# Patient Record
Sex: Male | Born: 1955 | Race: White | Hispanic: No | Marital: Single | Smoking: Current some day smoker
Health system: Southern US, Community
[De-identification: ages and names within clinical notes are randomized; demographics above are authoritative.]

## PROBLEM LIST (undated history)

## (undated) DIAGNOSIS — E119 Type 2 diabetes mellitus without complications: Secondary | ICD-10-CM

## (undated) DIAGNOSIS — J449 Chronic obstructive pulmonary disease, unspecified: Secondary | ICD-10-CM

## (undated) DIAGNOSIS — F259 Schizoaffective disorder, unspecified: Secondary | ICD-10-CM

## (undated) HISTORY — DX: Type 2 diabetes mellitus without complications: E11.9

## (undated) HISTORY — DX: Chronic obstructive pulmonary disease, unspecified: J44.9

## (undated) HISTORY — DX: Schizoaffective disorder, unspecified: F25.9

---

## 2000-08-02 ENCOUNTER — Emergency Department (HOSPITAL_COMMUNITY): Admission: EM | Admit: 2000-08-02 | Discharge: 2000-08-02 | Payer: Self-pay | Admitting: *Deleted

## 2000-10-30 ENCOUNTER — Emergency Department (HOSPITAL_COMMUNITY): Admission: EM | Admit: 2000-10-30 | Discharge: 2000-10-30 | Payer: Self-pay | Admitting: Emergency Medicine

## 2000-11-03 ENCOUNTER — Inpatient Hospital Stay (HOSPITAL_COMMUNITY): Admission: EM | Admit: 2000-11-03 | Discharge: 2000-11-10 | Payer: Self-pay | Admitting: Psychiatry

## 2001-01-27 ENCOUNTER — Encounter: Payer: Self-pay | Admitting: *Deleted

## 2001-01-28 ENCOUNTER — Inpatient Hospital Stay (HOSPITAL_COMMUNITY): Admission: EM | Admit: 2001-01-28 | Discharge: 2001-02-02 | Payer: Self-pay | Admitting: Psychiatry

## 2001-02-02 ENCOUNTER — Emergency Department (HOSPITAL_COMMUNITY): Admission: EM | Admit: 2001-02-02 | Discharge: 2001-02-03 | Payer: Self-pay | Admitting: *Deleted

## 2001-03-07 ENCOUNTER — Encounter: Payer: Self-pay | Admitting: Emergency Medicine

## 2001-03-07 ENCOUNTER — Emergency Department (HOSPITAL_COMMUNITY): Admission: EM | Admit: 2001-03-07 | Discharge: 2001-03-07 | Payer: Self-pay | Admitting: Emergency Medicine

## 2001-03-08 ENCOUNTER — Emergency Department (HOSPITAL_COMMUNITY): Admission: EM | Admit: 2001-03-08 | Discharge: 2001-03-08 | Payer: Self-pay | Admitting: Emergency Medicine

## 2001-03-09 ENCOUNTER — Emergency Department (HOSPITAL_COMMUNITY): Admission: EM | Admit: 2001-03-09 | Discharge: 2001-03-10 | Payer: Self-pay | Admitting: Emergency Medicine

## 2002-11-26 ENCOUNTER — Emergency Department (HOSPITAL_COMMUNITY): Admission: EM | Admit: 2002-11-26 | Discharge: 2002-11-26 | Payer: Self-pay

## 2002-11-30 ENCOUNTER — Emergency Department (HOSPITAL_COMMUNITY): Admission: EM | Admit: 2002-11-30 | Discharge: 2002-11-30 | Payer: Self-pay | Admitting: Emergency Medicine

## 2002-12-07 ENCOUNTER — Emergency Department (HOSPITAL_COMMUNITY): Admission: EM | Admit: 2002-12-07 | Discharge: 2002-12-07 | Payer: Self-pay | Admitting: Emergency Medicine

## 2003-07-27 ENCOUNTER — Emergency Department (HOSPITAL_COMMUNITY): Admission: EM | Admit: 2003-07-27 | Discharge: 2003-07-27 | Payer: Self-pay | Admitting: Emergency Medicine

## 2003-08-19 ENCOUNTER — Inpatient Hospital Stay (HOSPITAL_COMMUNITY): Admission: RE | Admit: 2003-08-19 | Discharge: 2003-08-26 | Payer: Self-pay | Admitting: Psychiatry

## 2003-08-19 ENCOUNTER — Emergency Department (HOSPITAL_COMMUNITY): Admission: EM | Admit: 2003-08-19 | Discharge: 2003-08-19 | Payer: Self-pay | Admitting: *Deleted

## 2003-12-26 ENCOUNTER — Ambulatory Visit (HOSPITAL_COMMUNITY): Admission: RE | Admit: 2003-12-26 | Discharge: 2003-12-26 | Payer: Self-pay | Admitting: *Deleted

## 2004-05-04 ENCOUNTER — Emergency Department (HOSPITAL_COMMUNITY): Admission: EM | Admit: 2004-05-04 | Discharge: 2004-05-05 | Payer: Self-pay | Admitting: Emergency Medicine

## 2004-06-20 ENCOUNTER — Inpatient Hospital Stay (HOSPITAL_COMMUNITY): Admission: EM | Admit: 2004-06-20 | Discharge: 2004-06-22 | Payer: Self-pay | Admitting: Emergency Medicine

## 2004-12-17 ENCOUNTER — Emergency Department (HOSPITAL_COMMUNITY): Admission: EM | Admit: 2004-12-17 | Discharge: 2004-12-18 | Payer: Self-pay | Admitting: Emergency Medicine

## 2004-12-21 ENCOUNTER — Emergency Department (HOSPITAL_COMMUNITY): Admission: EM | Admit: 2004-12-21 | Discharge: 2004-12-21 | Payer: Self-pay | Admitting: Emergency Medicine

## 2008-06-06 ENCOUNTER — Ambulatory Visit: Payer: Self-pay | Admitting: Psychiatry

## 2008-06-06 ENCOUNTER — Emergency Department (HOSPITAL_COMMUNITY): Admission: EM | Admit: 2008-06-06 | Discharge: 2008-06-06 | Payer: Self-pay | Admitting: Emergency Medicine

## 2008-06-06 ENCOUNTER — Inpatient Hospital Stay (HOSPITAL_COMMUNITY): Admission: RE | Admit: 2008-06-06 | Discharge: 2008-06-24 | Payer: Self-pay | Admitting: Psychiatry

## 2008-07-17 ENCOUNTER — Emergency Department (HOSPITAL_COMMUNITY): Admission: EM | Admit: 2008-07-17 | Discharge: 2008-07-17 | Payer: Self-pay | Admitting: Emergency Medicine

## 2008-08-06 ENCOUNTER — Inpatient Hospital Stay (HOSPITAL_COMMUNITY): Admission: EM | Admit: 2008-08-06 | Discharge: 2008-08-09 | Payer: Self-pay | Admitting: Emergency Medicine

## 2008-09-17 ENCOUNTER — Other Ambulatory Visit: Payer: Self-pay | Admitting: Emergency Medicine

## 2008-09-18 ENCOUNTER — Ambulatory Visit: Payer: Self-pay | Admitting: Psychiatry

## 2008-09-18 ENCOUNTER — Other Ambulatory Visit: Payer: Self-pay | Admitting: Emergency Medicine

## 2008-09-19 ENCOUNTER — Ambulatory Visit: Payer: Self-pay | Admitting: Psychiatry

## 2008-09-19 ENCOUNTER — Other Ambulatory Visit: Payer: Self-pay | Admitting: Emergency Medicine

## 2008-09-20 ENCOUNTER — Inpatient Hospital Stay (HOSPITAL_COMMUNITY): Admission: RE | Admit: 2008-09-20 | Discharge: 2008-10-03 | Payer: Self-pay | Admitting: Psychiatry

## 2008-09-20 ENCOUNTER — Other Ambulatory Visit: Payer: Self-pay | Admitting: Emergency Medicine

## 2010-03-21 IMAGING — CT CT HEAD W/O CM
1 series · 16 of 30 positions shown, 20 images · non-contrast
Comparison: 12/26/2003

CLINICAL DATA: Weakness when smoking

CT HEAD WITHOUT CONTRAST
TECHNIQUE: Contiguous axial images were obtained from the base of
the skull through the vertex without contrast

[Series 2: head routine 4.8 h37s · axial · 0.48mm/px · z∈[-176,-13]mm · 16 of 36 slices shown, 20 images]
[im 2/36  brain]
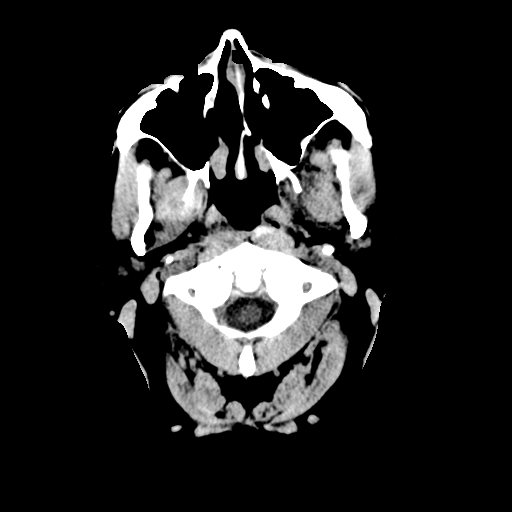
[im 2/36  bone]
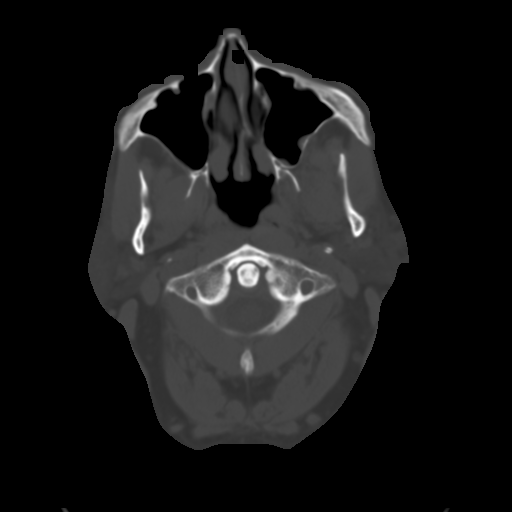
[im 4/36  brain]
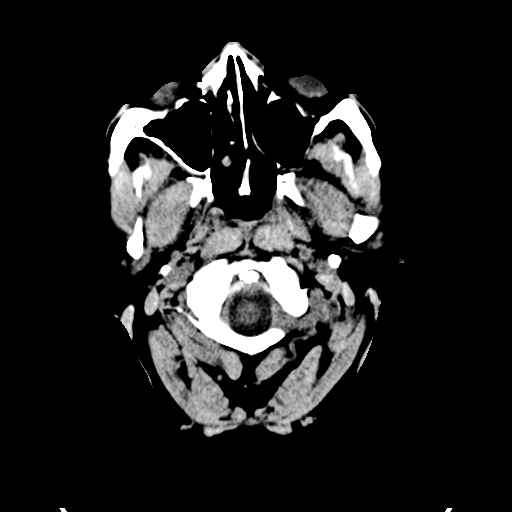
[im 7/36  brain]
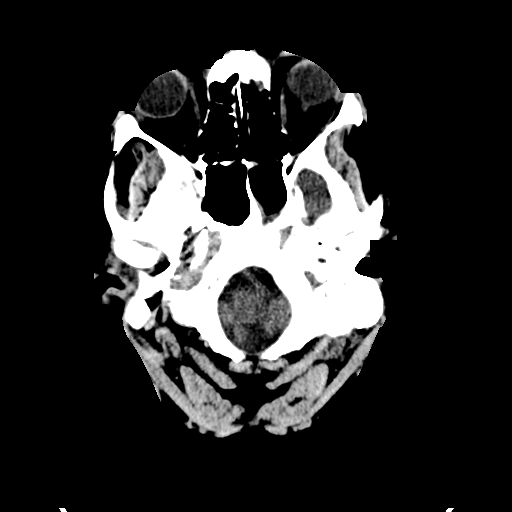
[im 9/36  brain]
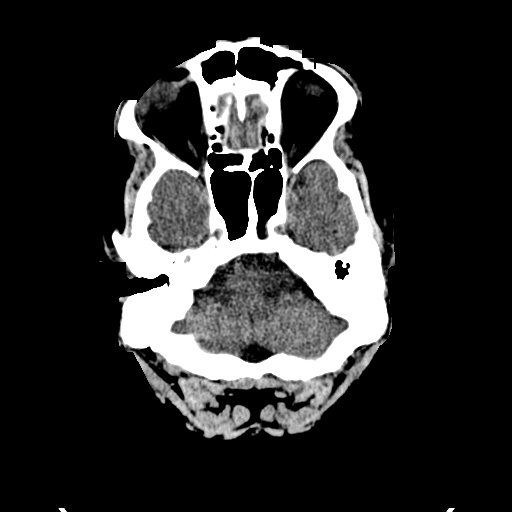
[im 10/36  brain]
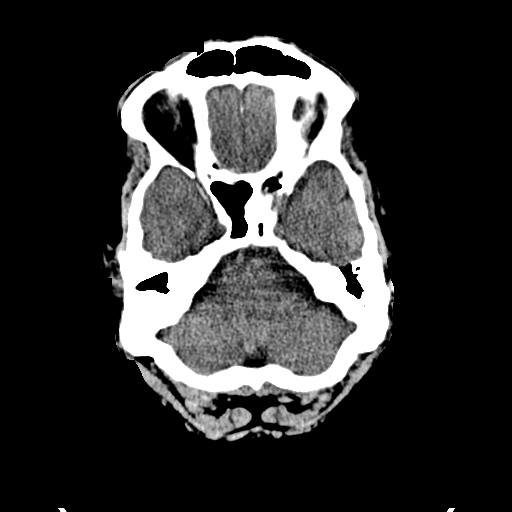
[im 10/36  bone]
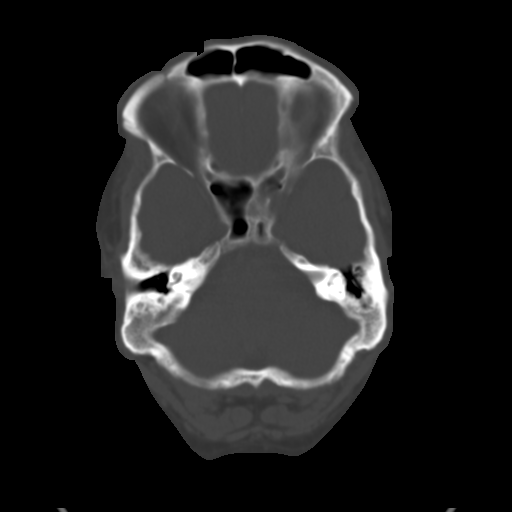
[im 13/36  brain]
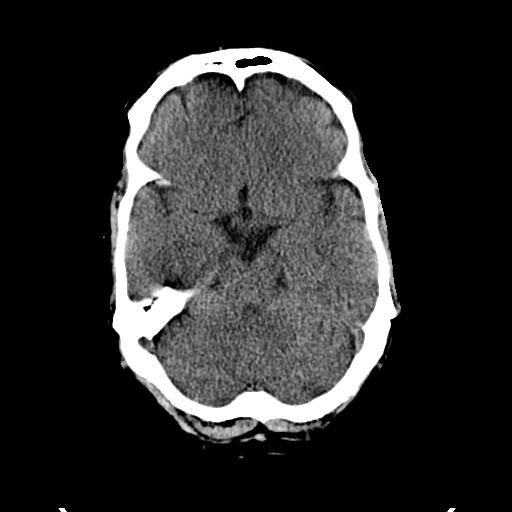
[im 15/36  brain]
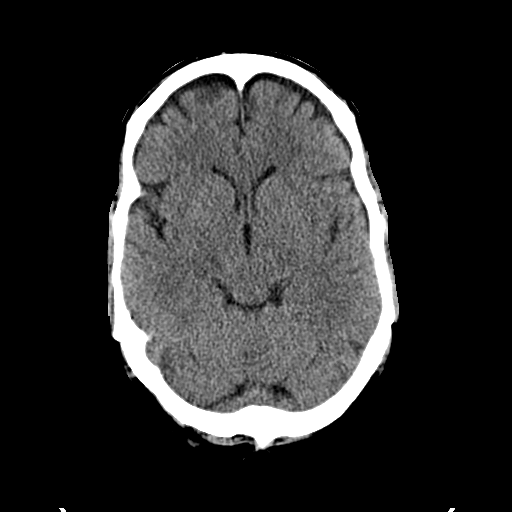
[im 17/36  brain]
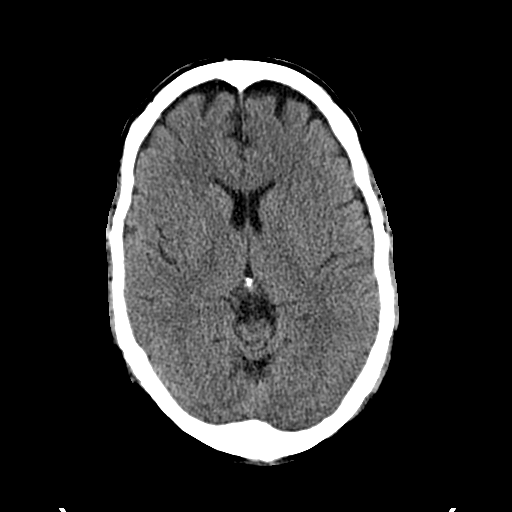
[im 19/36  brain]
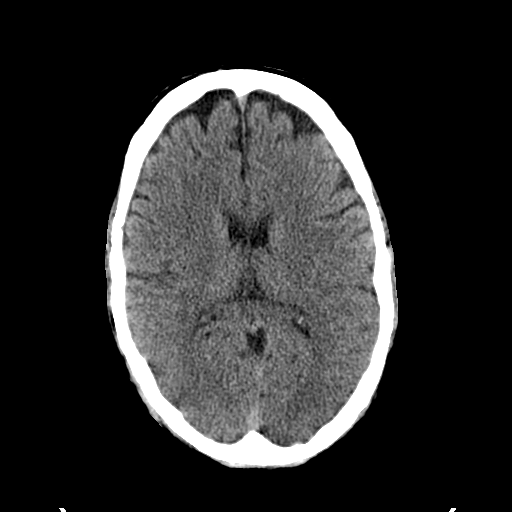
[im 19/36  bone]
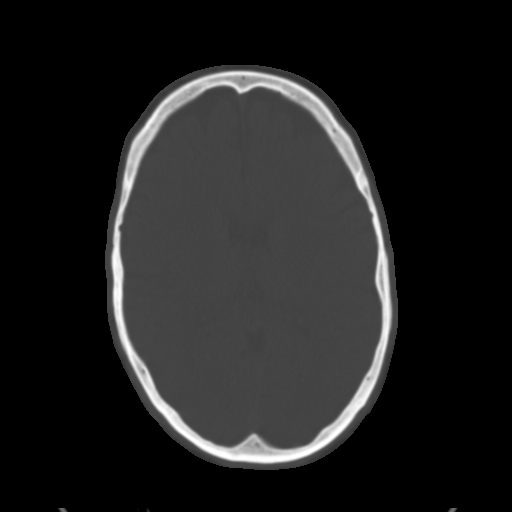
[im 21/36  brain]
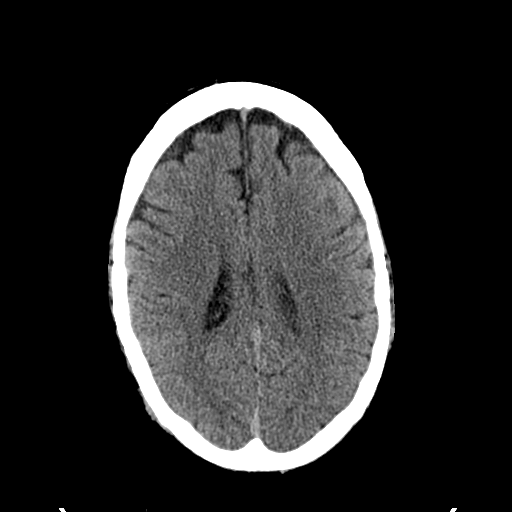
[im 23/36  brain]
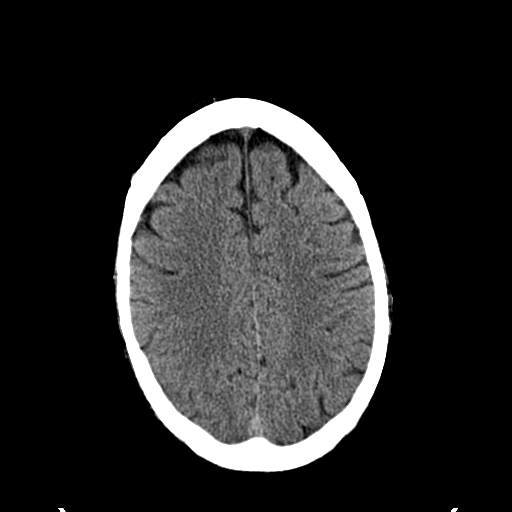
[im 26/36  brain]
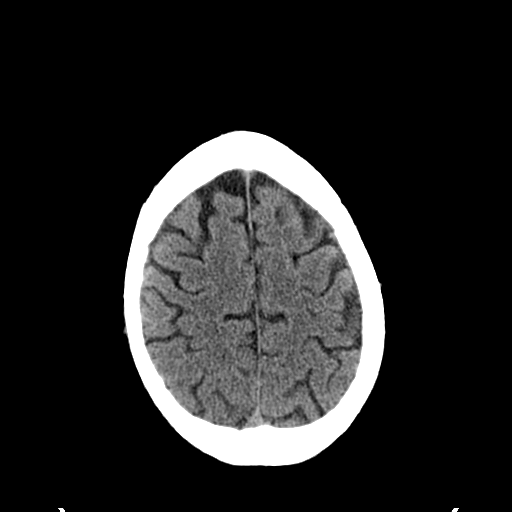
[im 27/36  brain]
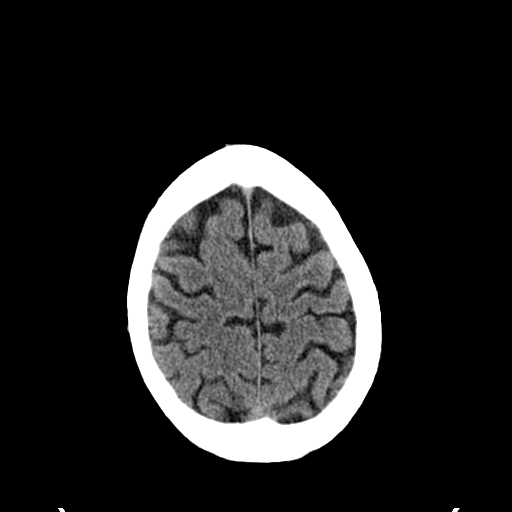
[im 27/36  bone]
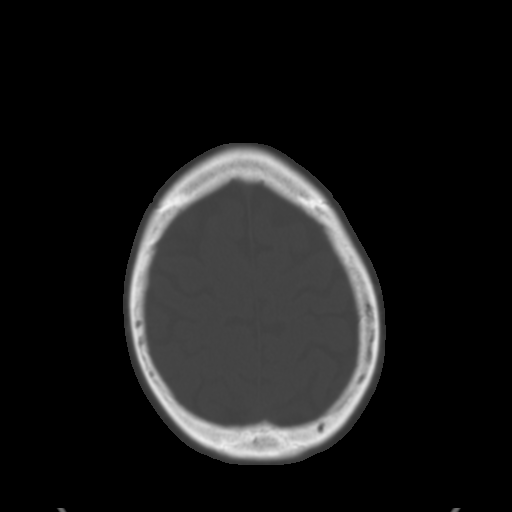
[im 29/36  brain]
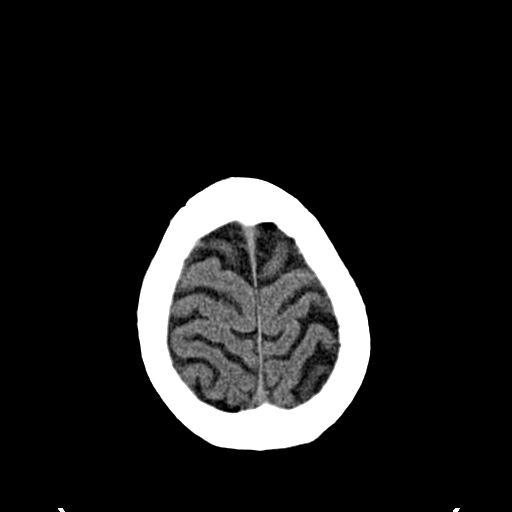
[im 32/36  brain]
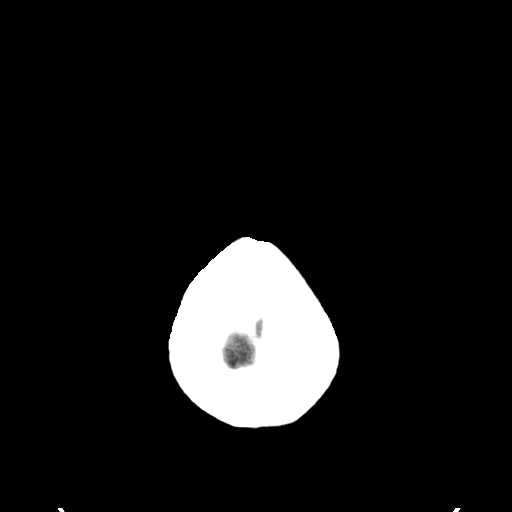
[im 34/36  brain]
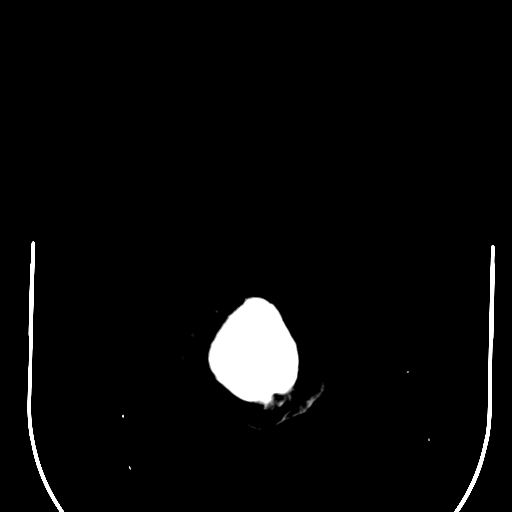

[16 of 30 positions shown; findings below may reference images not displayed]

FINDINGS: No evidence for hemorrhage, acute infarction,
hydrocephalus, or mass lesion.  There is no extra axial fluid
collection. Moderate bifrontal cortical atrophy, increased in
degree when compared to 07/17/2008 The skull and paranasal sinuses
are normal.
IMPRESSION: No acute findings.  No mass.  No acute infarction.  Mild frontal
cortical atrophy increased in degree since prior exam.

## 2010-04-09 IMAGING — CT CT HEAD W/O CM
1 of 2 series · 16 of 30 positions shown, 20 images · non-contrast
Comparison: 07/17/2008

CLINICAL DATA: Altered level of consciousness.  Hypotension and
hypoglycemia.

CT HEAD WITHOUT CONTRAST
TECHNIQUE: Contiguous axial images were obtained from the base of
the skull through the vertex without contrast.

[Series 3: recon 2: brain · axial · 0.47mm/px · z∈[+72,+214]mm · 16 of 144 slices shown, 20 images]
[im 8/144  brain]
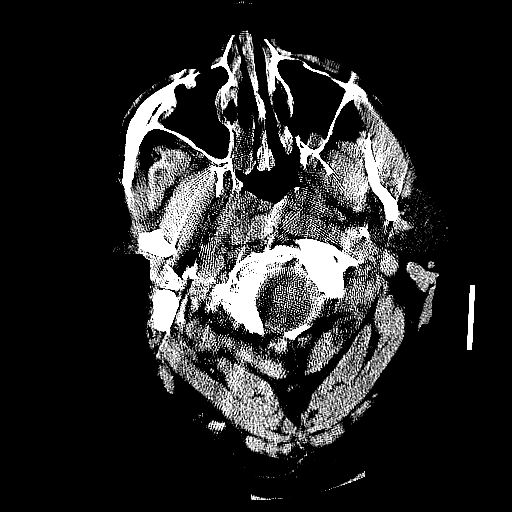
[im 8/144  bone]
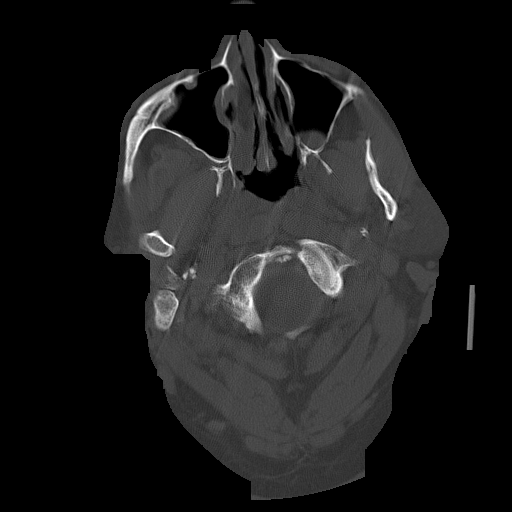
[im 16/144  brain]
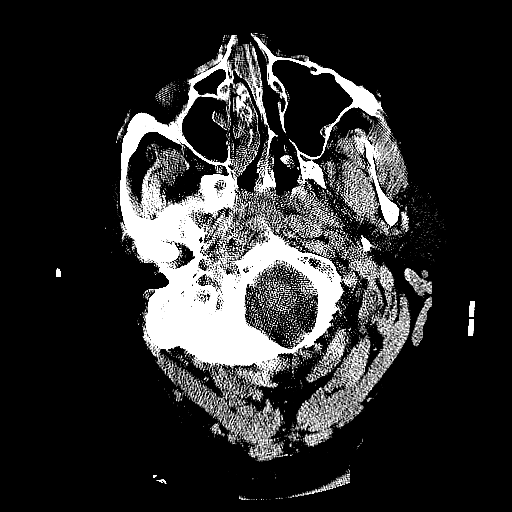
[im 23/144  brain]
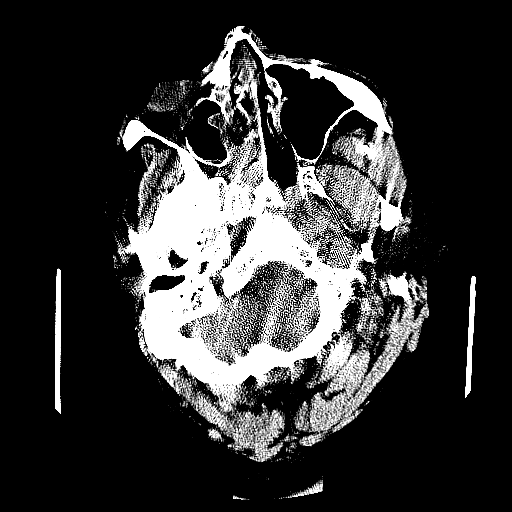
[im 31/144  brain]
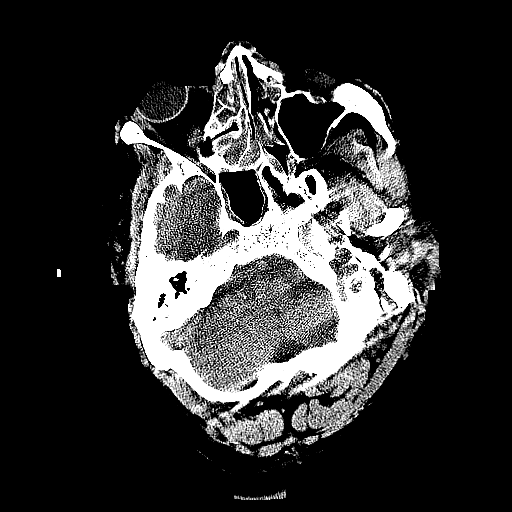
[im 46/144  brain]
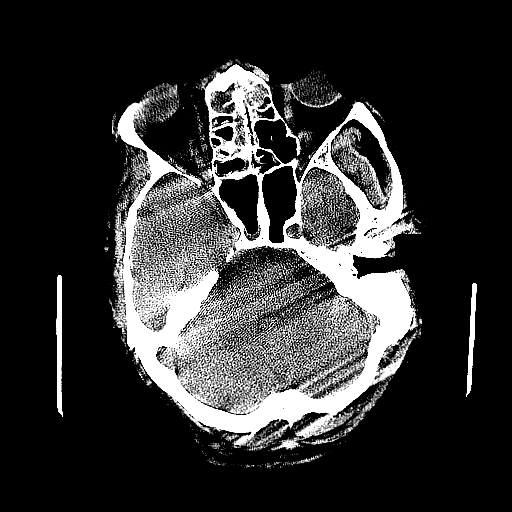
[im 46/144  bone]
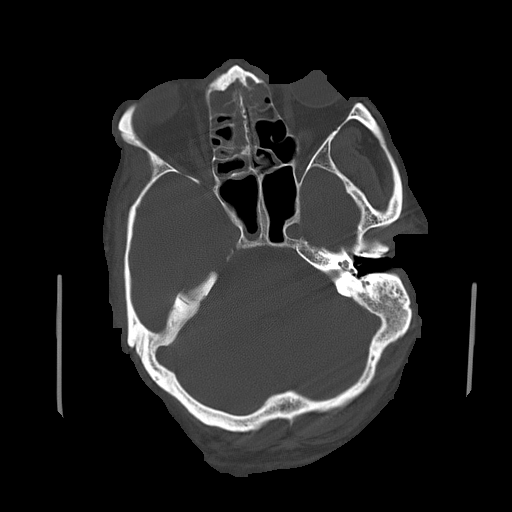
[im 53/144  brain]
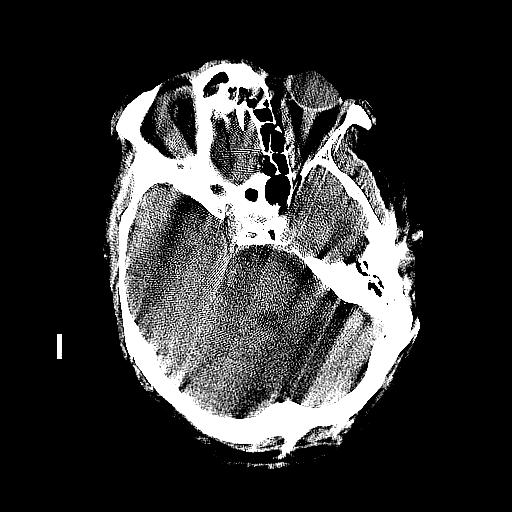
[im 61/144  brain]
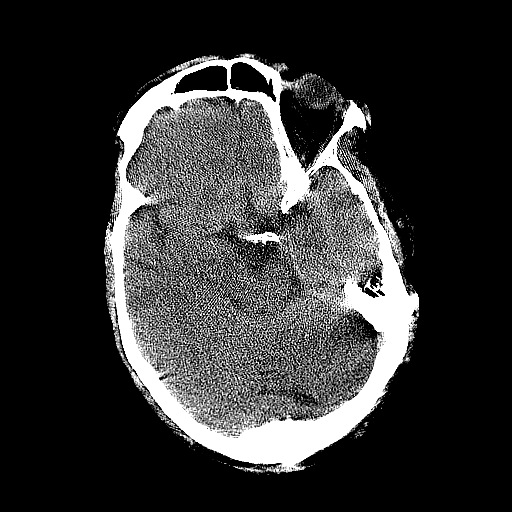
[im 68/144  brain]
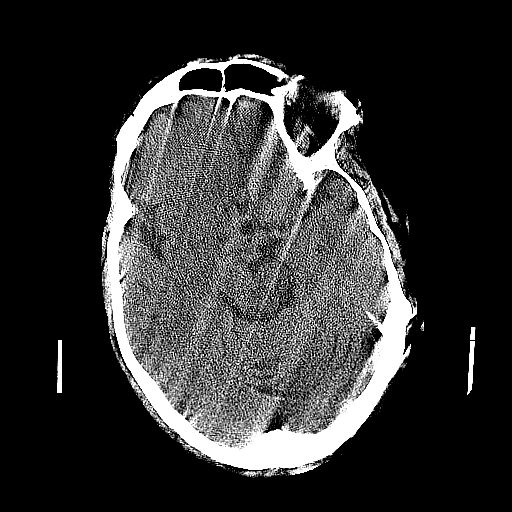
[im 76/144  brain]
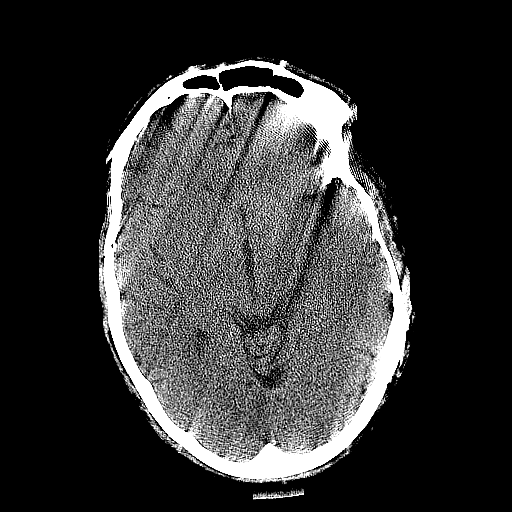
[im 76/144  bone]
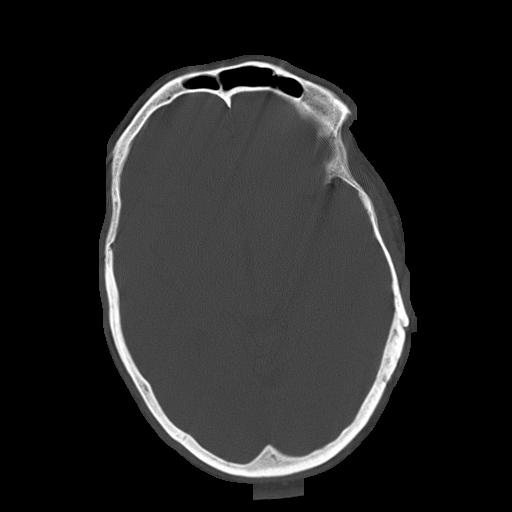
[im 83/144  brain]
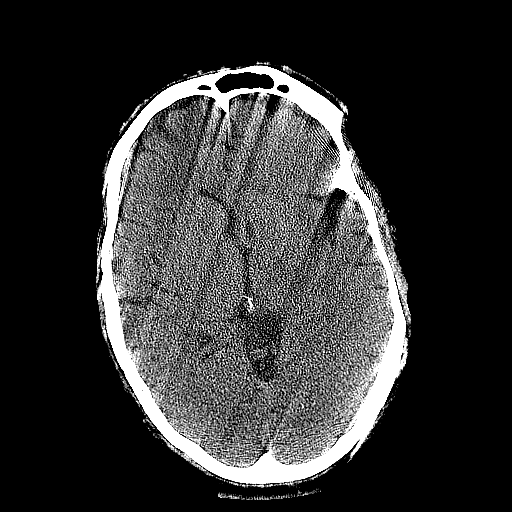
[im 91/144  brain]
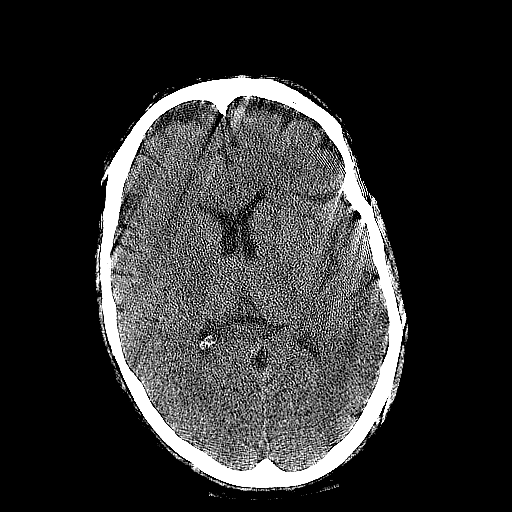
[im 98/144  brain]
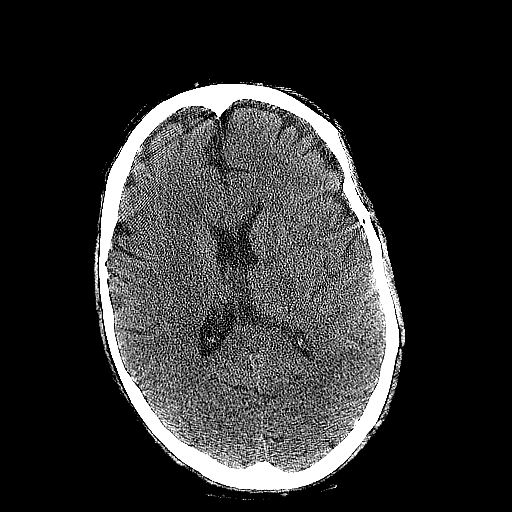
[im 113/144  brain]
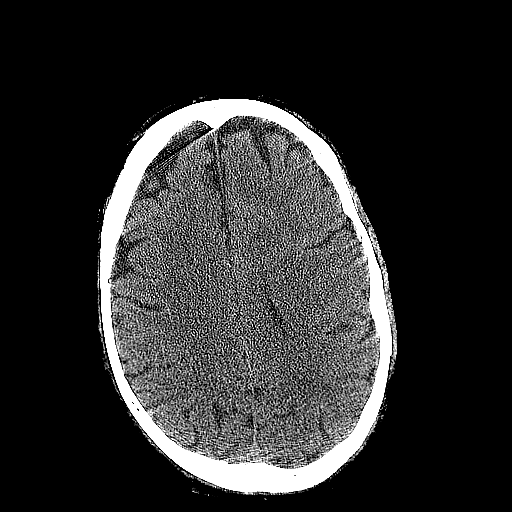
[im 113/144  bone]
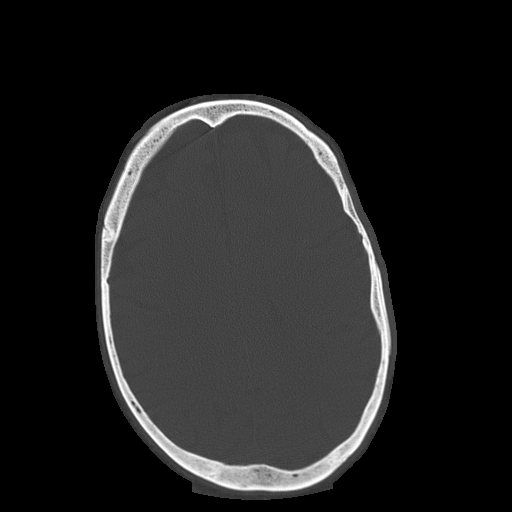
[im 121/144  brain]
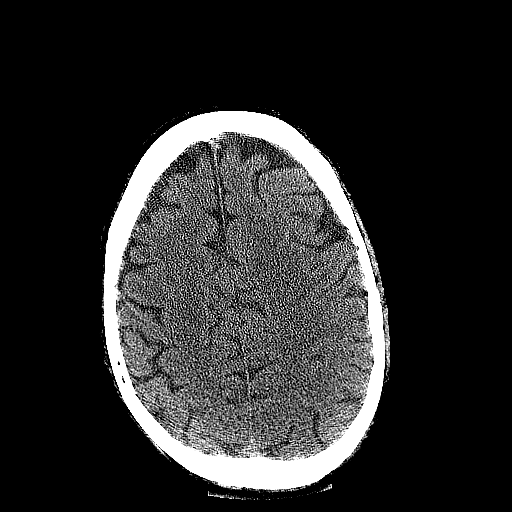
[im 128/144  brain]
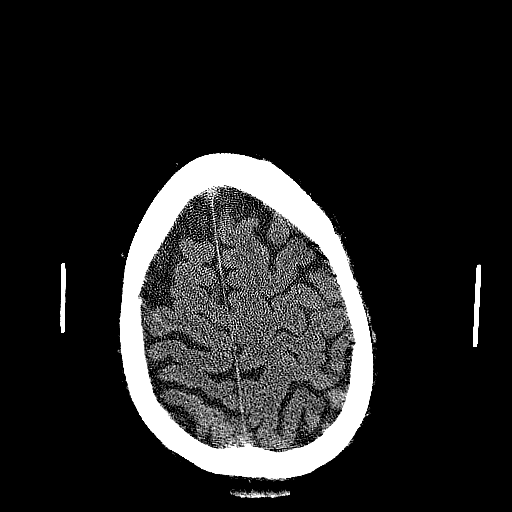
[im 136/144  brain]
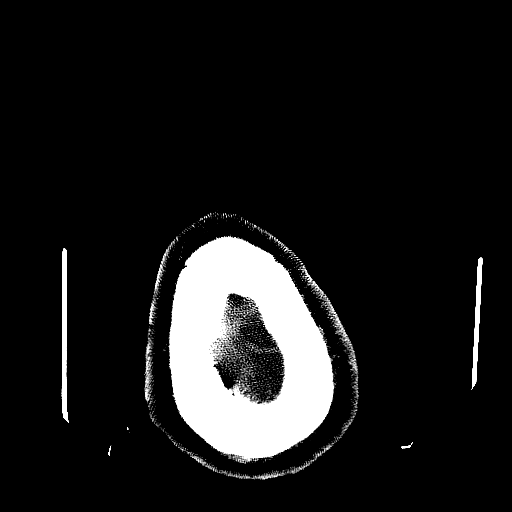

[16 of 30 positions shown; findings below may reference images not displayed]

FINDINGS: Since the prior study the patient has developed mucosal
thickening in the ethmoid air cells consistent with ethmoid
sinusitis.  There is a small retention cyst in the base of the left
maxillary sinus. There is also some new mucosal thickening of the
sphenoid sinus.

There is no acute intracranial hemorrhage, acute infarction, or
intracranial mass lesion.  The ventricles are normal in size.
IMPRESSION: 1.  No change in the appearance of the brain since the prior study.
No acute abnormalities.
2.  The patient has developed ethmoid and sphenoid sinusitis since
the prior exam.

## 2010-07-30 ENCOUNTER — Encounter: Payer: Self-pay | Admitting: Family Medicine

## 2010-08-12 ENCOUNTER — Encounter: Payer: Self-pay | Admitting: Family Medicine

## 2010-08-21 LAB — GLUCOSE, CAPILLARY
Glucose-Capillary: 105 mg/dL — ABNORMAL HIGH (ref 70–99)
Glucose-Capillary: 157 mg/dL — ABNORMAL HIGH (ref 70–99)
Glucose-Capillary: 171 mg/dL — ABNORMAL HIGH (ref 70–99)
Glucose-Capillary: 90 mg/dL (ref 70–99)
Glucose-Capillary: 93 mg/dL (ref 70–99)
Glucose-Capillary: 96 mg/dL (ref 70–99)
Glucose-Capillary: 97 mg/dL (ref 70–99)
Glucose-Capillary: 98 mg/dL (ref 70–99)
Glucose-Capillary: 110 mg/dL — ABNORMAL HIGH (ref 70–99)
Glucose-Capillary: 112 mg/dL — ABNORMAL HIGH (ref 70–99)
Glucose-Capillary: 163 mg/dL — ABNORMAL HIGH (ref 70–99)
Glucose-Capillary: 194 mg/dL — ABNORMAL HIGH (ref 70–99)
Glucose-Capillary: 75 mg/dL (ref 70–99)
Glucose-Capillary: 96 mg/dL (ref 70–99)

## 2010-08-21 LAB — DIFFERENTIAL
Basophils Absolute: 0 10*3/uL (ref 0.0–0.1)
Basophils Relative: 0 % (ref 0–1)
Eosinophils Relative: 5 % (ref 0–5)
Monocytes Absolute: 0.7 10*3/uL (ref 0.1–1.0)

## 2010-08-21 LAB — VALPROIC ACID LEVEL: Valproic Acid Lvl: 102 ug/mL — ABNORMAL HIGH (ref 50.0–100.0)

## 2010-08-21 LAB — CBC
Platelets: 225 10*3/uL (ref 150–400)
WBC: 6.3 10*3/uL (ref 4.0–10.5)

## 2010-08-21 LAB — COMPREHENSIVE METABOLIC PANEL
AST: 25 U/L (ref 0–37)
Albumin: 4.2 g/dL (ref 3.5–5.2)
Alkaline Phosphatase: 54 U/L (ref 39–117)
Chloride: 109 mEq/L (ref 96–112)
GFR calc Af Amer: >60 mL/min (ref >60)
Potassium: 4 mEq/L (ref 3.5–5.1)
Sodium: 139 mEq/L (ref 135–145)
Total Bilirubin: 0.4 mg/dL (ref 0.3–1.2)

## 2010-08-21 LAB — URINALYSIS, ROUTINE W REFLEX MICROSCOPIC
Bilirubin Urine: NEGATIVE
Glucose, UA: NEGATIVE mg/dL
Hgb urine dipstick: NEGATIVE
Specific Gravity, Urine: 1.009 (ref 1.005–1.030)
Urobilinogen, UA: 0.2 mg/dL (ref 0.0–1.0)
pH: 6 (ref 5.0–8.0)

## 2010-08-21 LAB — RAPID URINE DRUG SCREEN, HOSP PERFORMED
Amphetamines: NOT DETECTED
Benzodiazepines: POSITIVE — AB
Tetrahydrocannabinol: NOT DETECTED

## 2010-08-23 LAB — LITHIUM LEVEL
Lithium Lvl: 1.48 mEq/L — ABNORMAL HIGH (ref 0.80–1.40)
Lithium Lvl: 2.59 mEq/L (ref 0.80–1.40)
Lithium Lvl: 1.39 mEq/L (ref 0.80–1.40)

## 2010-08-23 LAB — RAPID URINE DRUG SCREEN, HOSP PERFORMED
Benzodiazepines: POSITIVE — AB
Cocaine: NOT DETECTED
Opiates: POSITIVE — AB

## 2010-08-23 LAB — GLUCOSE, CAPILLARY
Glucose-Capillary: 117 mg/dL — ABNORMAL HIGH (ref 70–99)
Glucose-Capillary: 137 mg/dL — ABNORMAL HIGH (ref 70–99)
Glucose-Capillary: 139 mg/dL — ABNORMAL HIGH (ref 70–99)
Glucose-Capillary: 141 mg/dL — ABNORMAL HIGH (ref 70–99)
Glucose-Capillary: 155 mg/dL — ABNORMAL HIGH (ref 70–99)
Glucose-Capillary: 171 mg/dL — ABNORMAL HIGH (ref 70–99)
Glucose-Capillary: 175 mg/dL — ABNORMAL HIGH (ref 70–99)
Glucose-Capillary: 36 mg/dL — CL (ref 70–99)
Glucose-Capillary: 95 mg/dL (ref 70–99)

## 2010-08-23 LAB — LIPID PANEL
HDL: 25 mg/dL — ABNORMAL LOW (ref >39)
Total CHOL/HDL Ratio: 2.8 RATIO
Triglycerides: 76 mg/dL (ref <150)
VLDL: 15 mg/dL (ref 0–40)

## 2010-08-23 LAB — OSMOLALITY: Osmolality: 300 mOsm/kg (ref 275–300)

## 2010-08-23 LAB — URINALYSIS, ROUTINE W REFLEX MICROSCOPIC
Glucose, UA: NEGATIVE mg/dL
Ketones, ur: 15 mg/dL — AB
Leukocytes, UA: NEGATIVE
Protein, ur: 30 mg/dL — AB
Urobilinogen, UA: 1 mg/dL (ref 0.0–1.0)

## 2010-08-23 LAB — COMPREHENSIVE METABOLIC PANEL
ALT: 18 U/L (ref 0–53)
ALT: 18 U/L (ref 0–53)
AST: 17 U/L (ref 0–37)
Alkaline Phosphatase: 64 U/L (ref 39–117)
BUN: 45 mg/dL — ABNORMAL HIGH (ref 6–23)
BUN: 14 mg/dL (ref 6–23)
CO2: 23 mEq/L (ref 19–32)
CO2: 26 mEq/L (ref 19–32)
Calcium: 9.3 mg/dL (ref 8.4–10.5)
Chloride: 114 mEq/L — ABNORMAL HIGH (ref 96–112)
Chloride: 112 mEq/L (ref 96–112)
Creatinine, Ser: 1.44 mg/dL (ref 0.4–1.5)
GFR calc non Af Amer: 19 mL/min — ABNORMAL LOW (ref >60)
Glucose, Bld: 42 mg/dL — ABNORMAL LOW (ref 70–99)
Glucose, Bld: 94 mg/dL (ref 70–99)
Potassium: 3.8 mEq/L (ref 3.5–5.1)
Potassium: 4 mEq/L (ref 3.5–5.1)
Sodium: 140 mEq/L (ref 135–145)
Total Bilirubin: 0.5 mg/dL (ref 0.3–1.2)
Total Protein: 5.5 g/dL — ABNORMAL LOW (ref 6.0–8.3)

## 2010-08-23 LAB — BASIC METABOLIC PANEL
BUN: 13 mg/dL (ref 6–23)
BUN: 7 mg/dL (ref 6–23)
BUN: 9 mg/dL (ref 6–23)
CO2: 19 mEq/L (ref 19–32)
CO2: 24 mEq/L (ref 19–32)
Calcium: 9.3 mg/dL (ref 8.4–10.5)
Calcium: 9.3 mg/dL (ref 8.4–10.5)
Calcium: 9.5 mg/dL (ref 8.4–10.5)
Calcium: 9.7 mg/dL (ref 8.4–10.5)
Chloride: 116 mEq/L — ABNORMAL HIGH (ref 96–112)
Creatinine, Ser: 1.28 mg/dL (ref 0.4–1.5)
Creatinine, Ser: 2.18 mg/dL — ABNORMAL HIGH (ref 0.4–1.5)
Creatinine, Ser: 2.78 mg/dL — ABNORMAL HIGH (ref 0.4–1.5)
GFR calc Af Amer: 29 mL/min — ABNORMAL LOW (ref >60)
GFR calc Af Amer: 39 mL/min — ABNORMAL LOW (ref >60)
GFR calc non Af Amer: 24 mL/min — ABNORMAL LOW (ref >60)
GFR calc non Af Amer: 59 mL/min — ABNORMAL LOW (ref >60)
GFR calc non Af Amer: >60 mL/min (ref >60)
Glucose, Bld: 125 mg/dL — ABNORMAL HIGH (ref 70–99)
Glucose, Bld: 136 mg/dL — ABNORMAL HIGH (ref 70–99)
Potassium: 4.3 mEq/L (ref 3.5–5.1)
Potassium: 4.4 mEq/L (ref 3.5–5.1)
Potassium: 4.5 mEq/L (ref 3.5–5.1)

## 2010-08-23 LAB — CBC
HCT: 30.9 % — ABNORMAL LOW (ref 39.0–52.0)
HCT: 32.4 % — ABNORMAL LOW (ref 39.0–52.0)
HCT: 34.4 % — ABNORMAL LOW (ref 39.0–52.0)
HCT: 37.7 % — ABNORMAL LOW (ref 39.0–52.0)
Hemoglobin: 11.6 g/dL — ABNORMAL LOW (ref 13.0–17.0)
Hemoglobin: 12.9 g/dL — ABNORMAL LOW (ref 13.0–17.0)
MCHC: 33.7 g/dL (ref 30.0–36.0)
MCHC: 34.4 g/dL (ref 30.0–36.0)
MCV: 96.3 fL (ref 78.0–100.0)
Platelets: 210 10*3/uL (ref 150–400)
Platelets: 227 10*3/uL (ref 150–400)
Platelets: 209 10*3/uL (ref 150–400)
RBC: 3.38 MIL/uL — ABNORMAL LOW (ref 4.22–5.81)
RBC: 3.58 MIL/uL — ABNORMAL LOW (ref 4.22–5.81)
RDW: 13.4 % (ref 11.5–15.5)
RDW: 13.4 % (ref 11.5–15.5)
WBC: 10.2 10*3/uL (ref 4.0–10.5)
WBC: 10.4 10*3/uL (ref 4.0–10.5)
WBC: 9.3 10*3/uL (ref 4.0–10.5)
WBC: 7.4 10*3/uL (ref 4.0–10.5)

## 2010-08-23 LAB — HEMOGLOBIN A1C
Hgb A1c MFr Bld: 5.2 % (ref 4.6–6.1)
Mean Plasma Glucose: 103 mg/dL

## 2010-08-23 LAB — DIFFERENTIAL
Basophils Absolute: 0 10*3/uL (ref 0.0–0.1)
Basophils Relative: 0 % (ref 0–1)
Eosinophils Absolute: 0.1 10*3/uL (ref 0.0–0.7)
Eosinophils Absolute: 0.4 10*3/uL (ref 0.0–0.7)
Eosinophils Relative: 5 % (ref 0–5)
Lymphs Abs: 2.4 10*3/uL (ref 0.7–4.0)
Monocytes Absolute: 0.5 10*3/uL (ref 0.1–1.0)
Monocytes Relative: 7 % (ref 3–12)
Neutro Abs: 6.4 10*3/uL (ref 1.7–7.7)
Neutrophils Relative %: 68 % (ref 43–77)

## 2010-08-23 LAB — MAGNESIUM: Magnesium: 2.3 mg/dL (ref 1.5–2.5)

## 2010-08-23 LAB — URINE MICROSCOPIC-ADD ON

## 2010-08-23 LAB — AMMONIA: Ammonia: 20 umol/L (ref 11–35)

## 2010-08-23 LAB — ETHANOL: Alcohol, Ethyl (B): <5 mg/dL (ref 0–10)

## 2010-08-27 LAB — BASIC METABOLIC PANEL
BUN: 11 mg/dL (ref 6–23)
Calcium: 8.9 mg/dL (ref 8.4–10.5)
Chloride: 108 mEq/L (ref 96–112)
Creatinine, Ser: 0.97 mg/dL (ref 0.4–1.5)
GFR calc Af Amer: >60 mL/min (ref >60)
GFR calc non Af Amer: 57 mL/min — ABNORMAL LOW (ref >60)
Sodium: 137 mEq/L (ref 135–145)

## 2010-08-27 LAB — CBC
MCV: 94.9 fL (ref 78.0–100.0)
Platelets: 310 10*3/uL (ref 150–400)
WBC: 9.6 10*3/uL (ref 4.0–10.5)

## 2010-08-27 LAB — DIFFERENTIAL
Basophils Absolute: 0 10*3/uL (ref 0.0–0.1)
Basophils Relative: 0 % (ref 0–1)
Eosinophils Absolute: 0 10*3/uL (ref 0.0–0.7)
Lymphs Abs: 2.5 10*3/uL (ref 0.7–4.0)
Neutrophils Relative %: 62 % (ref 43–77)

## 2010-08-27 LAB — GLUCOSE, CAPILLARY
Glucose-Capillary: 128 mg/dL — ABNORMAL HIGH (ref 70–99)
Glucose-Capillary: 79 mg/dL (ref 70–99)
Glucose-Capillary: 87 mg/dL (ref 70–99)
Glucose-Capillary: 95 mg/dL (ref 70–99)
Glucose-Capillary: 99 mg/dL (ref 70–99)
Glucose-Capillary: 99 mg/dL (ref 70–99)

## 2010-08-27 LAB — RAPID URINE DRUG SCREEN, HOSP PERFORMED
Benzodiazepines: POSITIVE — AB
Cocaine: NOT DETECTED
Tetrahydrocannabinol: NOT DETECTED

## 2010-08-27 LAB — LITHIUM LEVEL
Lithium Lvl: 0.6 mEq/L — ABNORMAL LOW (ref 0.80–1.40)
Lithium Lvl: <0.25 mEq/L — ABNORMAL LOW (ref 0.80–1.40)

## 2010-08-27 LAB — ETHANOL: Alcohol, Ethyl (B): <5 mg/dL (ref 0–10)

## 2010-08-28 LAB — BASIC METABOLIC PANEL
Calcium: 9.2 mg/dL (ref 8.4–10.5)
Creatinine, Ser: 1.53 mg/dL — ABNORMAL HIGH (ref 0.4–1.5)
GFR calc Af Amer: 58 mL/min — ABNORMAL LOW (ref >60)
GFR calc non Af Amer: 48 mL/min — ABNORMAL LOW (ref >60)
Glucose, Bld: 192 mg/dL — ABNORMAL HIGH (ref 70–99)
Sodium: 130 mEq/L — ABNORMAL LOW (ref 135–145)

## 2010-08-28 LAB — GLUCOSE, CAPILLARY
Glucose-Capillary: 100 mg/dL — ABNORMAL HIGH (ref 70–99)
Glucose-Capillary: 100 mg/dL — ABNORMAL HIGH (ref 70–99)
Glucose-Capillary: 103 mg/dL — ABNORMAL HIGH (ref 70–99)
Glucose-Capillary: 107 mg/dL — ABNORMAL HIGH (ref 70–99)
Glucose-Capillary: 108 mg/dL — ABNORMAL HIGH (ref 70–99)
Glucose-Capillary: 116 mg/dL — ABNORMAL HIGH (ref 70–99)
Glucose-Capillary: 119 mg/dL — ABNORMAL HIGH (ref 70–99)
Glucose-Capillary: 126 mg/dL — ABNORMAL HIGH (ref 70–99)
Glucose-Capillary: 126 mg/dL — ABNORMAL HIGH (ref 70–99)
Glucose-Capillary: 140 mg/dL — ABNORMAL HIGH (ref 70–99)
Glucose-Capillary: 146 mg/dL — ABNORMAL HIGH (ref 70–99)
Glucose-Capillary: 174 mg/dL — ABNORMAL HIGH (ref 70–99)
Glucose-Capillary: 248 mg/dL — ABNORMAL HIGH (ref 70–99)
Glucose-Capillary: 275 mg/dL — ABNORMAL HIGH (ref 70–99)
Glucose-Capillary: 49 mg/dL — ABNORMAL LOW (ref 70–99)
Glucose-Capillary: 55 mg/dL — ABNORMAL LOW (ref 70–99)
Glucose-Capillary: 59 mg/dL — ABNORMAL LOW (ref 70–99)
Glucose-Capillary: 67 mg/dL — ABNORMAL LOW (ref 70–99)
Glucose-Capillary: 71 mg/dL (ref 70–99)
Glucose-Capillary: 91 mg/dL (ref 70–99)
Glucose-Capillary: 94 mg/dL (ref 70–99)

## 2010-08-28 LAB — LITHIUM LEVEL: Lithium Lvl: 1.14 mEq/L (ref 0.80–1.40)

## 2010-09-25 NOTE — Consult Note (Signed)
NAME:  NILAY, MANGRUM NO.:  [id number]   MEDICAL RECORD NO.:  [id number]          PATIENT TYPE:  EMS   LOCATION:  ED                           FACILITY:  [facility]   PHYSICIAN:  Antonietta Breach, M.D.  DATE OF BIRTH:  May 23, 1955   DATE OF CONSULTATION:  09/19/2008  DATE OF DISCHARGE:  09/17/2008                                 CONSULTATION   REQUESTING PHYSICIAN:  [facility].   REASON FOR CONSULTATION:  Psychosis, agitation.   HISTORY OF PRESENT ILLNESS:  Mr. Hellwig is a 55 year old male  presenting to the [facility] on the Sep 17, 2008 with suicidal  ideation.   Mr. Cretella has developed agitation as well as delusions.  He believes  that he is caught in a box that has wires coming out of it and  headphones.  He is worried that he cannot find a way out.  He does have  some difficulty with place orientation.  He believes that he is in  [place], [place].  However, he does know the year and the month.  He  does not know the day of the month.  He has required tranquilizing  medication including Haldol and Ativan.  He has been placed on a  standing antipsychotic regimen of Haldol 5 mg q.a.m. and 10 mg at  bedtime.  He currently is in 4-point restraint.   He was petitioned for commitment.  He had been found with superficial  cuts on his wrist.  He was having auditory and visual hallucinations at  the time.  He also was having paranoia.  There was some homicidal  ideation mentioned on his petition report.   PAST PSYCHIATRIC HISTORY:  Mr. Mackowski does have a prior psychiatric  history.  He is not known to be using alcohol or illegal drugs.  He was admitted to the [facility] in March 2010 and  at that time he was having suicidal ideation and auditory  hallucinations.   FAMILY PSYCHIATRIC HISTORY:  None known.   MEDICATIONS:  The medications listed prior to his presentation to the  [facility] included  lithium which apparently was at a dosage  of 600 mg at bedtime.  He also was on Wellbutrin, the dosages unclear.  At one entry it appears  that it was 600 mg daily.  In another entry it  appears that it was 150 mg daily.  He was on Elavil 25 mg at bedtime.  This medication list is from the [facility] facility.   SOCIAL HISTORY:  He has been residing in an assisted-living facility.  He does not use alcohol or illegal drugs.   OCCUPATION:  Medically disabled.   PAST MEDICAL HISTORY:  1. Chronic obstructive pulmonary disease.  2. Diabetes.  3. Deep vein thrombosis.  4. Hypercholesterolemia.  5. 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   MEDICATIONS:  MAR is reviewed.  His psychotropics currently acute  include:  1. Haldol 5 mg q.a.m. 10 mg at bedtime.  2. Atacand 2 mg q.8 h.   ALLERGIES:  PENICILLIN.  LABORATORY FINDINGS:  Sodium 139, BUN 13, creatinine 1.27, glucose 83,  SGOT 25, SGPT 25.  WBC 6.3, hemoglobin 13.9, platelet count 225,000.   REVIEW OF SYSTEMS:  Mr. Goin is not able to provide.  A significant  amount of this is gleaned from the medical record as well including the  past medical record.  Constitutional, head, eyes, ears, nose, throat,  mouth, neurologic, psychiatric, cardiovascular, respiratory,  gastrointestinal, genitourinary, skin, musculoskeletal, hematologic,  lymphatic, endocrine, metabolic, unremarkable.   PHYSICAL EXAMINATION:  VITAL SIGNS:  Temperature 98.3, pulse 86,  respiratory rate 20, blood pressure 124/84, oxygen saturation on room  air 95%.  GENERAL APPEARANCE:  Mr. Yale is a middle-aged male lying in a  supine position in 4-point restraints with no abnormal involuntary  movements.  Examination of the left elbow passive range of motion with  some slack in the restraints.  There is no cogwheeling or rigidity.   MENTAL STATUS EXAM:  Mr. Bellew is alert.  His eye contact is  intermittent.  His affect is mildly  anxious.  Mood is mildly anxious on  orientation testing.  Please see the history of present illness.  Memory  is patchy for recent events.  His fund of knowledge and intelligence are  below that of his estimated premorbid baseline.  His speech is soft with  a slightly flat prosody.  There is no dysarthria or pressure.  Thought  process involves some illogia.  There is tangentiality as well.  Thought  content:  Please see the history of present illness.  Insight is poor.  Judgment is impaired.   ASSESSMENT:  AXIS I:  293.81.  Psychotic disorder not otherwise  specified.  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  AXIS II:  Deferred.  AXIS III:  See past medical history.  AXIS IV:  General medical.  AXIS V:  20.   RECOMMENDATIONS:  1. Would continue with a quiet room, low stimulation, ego support and      would remind the patient with memory and orientation cues.  2. Concur with the Haldol regimen for antipsychosis.  Also he has been      demonstrating severe agitation which resulted in restraints.      Haldol can provide acute tranquilization.  3. Would continue to monitor for stiffness or other extrapyramidal      side effects.  4. Concur with Ativan 2 mg t.i.d. for additional tranquilization and      reduction of the amount of Haldol required.  5. Mr. Imhof is also having a significant amount of general      anxiety and the Ativan will provide comfort.  6. Would add an additional p.r.n. Ativan 0.5 to 3 mg p.o., IM or IV      q.4 h p.r.n. for any agitation breakthrough and to allow the      gradual discontinuation of his restraints.  7. Would admit to an inpatient psychiatric unit as soon as possible.      Antonietta Breach, M.D.  Electronically Signed     JW/MEDQ  D:  09/19/2008  T:  09/19/2008  Job:  [id number]

## 2010-09-25 NOTE — Consult Note (Signed)
NAME:  Ronald Martin, Ronald Martin NO.:  [id number]   MEDICAL RECORD NO.:  [id number]          PATIENT TYPE:  INP   LOCATION:  5024                         FACILITY:  [facility]   PHYSICIAN:  Ronald Martin, M.D.  DATE OF BIRTH:  22-Feb-1956   DATE OF CONSULTATION:  08/09/2008  DATE OF DISCHARGE:                                 CONSULTATION   REQUESTING PHYSICIAN:  [facility].   REASON FOR CONSULTATION:  Multiple episodes of lithium toxicity,  evaluate, recommend potential options, and schizoaffective disorder.   HISTORY OF PRESENT ILLNESS:  Ronald Martin is a 55 year old male  admitted to the [facility] on August 05, 2008, with lithium toxicity.  He has had initial confusion stupor and some impaired memory during the  initial 55-3 days of hospitalization.  However, today he has recovered  his memory at 2/3 objects on recall.  He also is completely oriented to  all spheres.  His mood is within normal limits.  His energy is slightly  decreased.  He does continue to have some vague false beliefs, that are  not disturbing, that he has had some research done with a medication at  an unknown facility and by an unknown doctor, this does not preoccupy  him.  He has no hallucinations.  He is socially appropriate and  cooperative.  He has been receiving his general medical care  appropriately.   He describes constructive future goals and interests.  He particularly  enjoys watching movies in his group home.  Please see the discussion of  the laboratory data below.   Ronald Martin does not recall any new medication given to him by any  healthcare [name] over the past month prior to admission.  He states  that his medication is dispensed by an administrator within the group  home and his lithium dosage has not changed.   PAST PSYCHIATRIC HISTORY:  Ronald Martin confirms that he has had  multiple episodes, as long as 12 days, involving decreased need for  sleep, racing  thoughts, elevated mood, increased energy, increased goal-  directed activity.  He states that during those times he has not engaged  in any counterproductive activity.   He also describes severe depression periods where he has had suicidal  ideation.   He does have a history of multiple episodes of psychiatric admission  and, as mentioned above, he does have some psychosis elements when his  mood is normal.  At this time he is essentially in remission of  hallucinations and delusions, except for a slight residual delusion  mentioned in the history of present illness that it is not disturbing or  preoccupying.  He is not having any command destructive auditory  hallucinations.   He mentions that he has been tried on Depakote in the past and that  there was a poor outcome.  He notes that on lithium he has been stable.   In review of the past medical record, as the patient recalls correctly,  Ronald Martin was admitted to the [facility] on  June 13, 2008, he was involuntarily committed.  He had  been  displaying paranoia and odd behavior for 4 days.  He was talking about  sexual fantasies with intermittent verbal outbursts.  He also was  yelling and intrusive.  His group home supervisor stated at that time  that he was taking all his medications as prescribed.   Past psychotropic trials are mentioned as above.  He also has been tried  on Xanax and Seroquel.  Please see the medication discussion below.   FAMILY PSYCHIATRIC HISTORY:  None known.   SOCIAL HISTORY:  Ronald Martin is single.  He has no children.  He is  medically disabled and unemployed.  He resides in [facility] group home.  He does not use alcohol or illegal drugs.   PAST MEDICAL HISTORY:  Status post lithium toxicity.  He has a history  of lithium toxicity in 2006 as well, at that time he had been taking  nonsteroidal anti-inflammatory drugs.   When discussing lithium toxicity with the patient,  he clearly does  understand the side effects experienced.  He spontaneously mentions the  word stupor on his own.  He is also familiar with the other signs of  toxicity including tremor, ataxia.  He is aware of the risk of seizures,  coma and death.  He is also aware that increased thirst develops early  on in lithium toxicity.   OTHER PAST MEDICAL HISTORY:  1. Diabetes type 2.  2. Hypertension.  3. Hypothyroidism.  4. Chronic obstructive pulmonary disease.  5. Hyperlipidemia.   MEDICATIONS ON ADMISSION:  1. Geodon 80 mg 2 times daily.  2. Wellbutrin 300 mg every morning.  3. Haldol 10 mg every morning and 20 mg every evening.  4. Diazepam 5 mg 3 times daily.  5. Elavil 25 mg nightly.  6. Gabapentin 300 mg 3 times daily.  7. Topamax 15 mg nightly.  8. Ambien 10 mg nightly.  9. Lithium 400 mg 2 times daily.   Medications psychiatrically, upon discharge from the [facility], on February 28th, included:  1. Lithium 450 mg 2 times daily.  2. Valium 5 mg 3 times daily.  3. Topamax 100 mg daily.  4. Synthroid 75 mcg daily.  5. Gabapentin 30 mg 4 times daily.  6. Ambien 10 mg nightly.  7. He was also on Haldol 10 mg every morning and 20 mg nightly.   CURRENT MEDICATIONS:  The MAR is reviewed.  He is on:  1. Wellbutrin 300 mg every morning.  2. Valium 5 mg 3 times daily.  3. Neurontin 300 mg 4 times daily.  4. His lithium has been removed.  5. He is on Ativan 1-2 mg q.8 h p.r.n.   ALLERGIES:  PENICILLIN.   LABORATORY DATA:  His initial lithium level was 2.59 on March 26th.  His  lithium level yesterday morning was 1.00.  Currently, sodium 141, BUN 7,  creatinine 1.11, glucose 125.  WBC 6.7, hemoglobin 10.6, platelet count  210.  Hemoglobin A1c is normal.  TSH was low at 0.044.  He is currently  not on Synthroid.   Urine drug screen March 26th positive benzodiazepine, positive opiates.  SGOT March 26th 20, SGPT 18.  Of note, his TSH on March 7th was also  low  at 0.135.   Head CT without contrast March 26th showed no change in the brain since  the prior study.  His study on July 17, 2008, showed no acute findings,  there was mild frontal, cortical atrophy.   Electrocardiogram QTc on  March 27th 454 milliseconds.   REVIEW OF SYSTEMS:  CONSTITUTIONAL:  HEAD, EYES, EARS, NOSE AND THROAT,  MOUTH, NEUROLOGIC, PSYCHIATRIC CARDIOVASCULAR, RESPIRATORY,  GASTROINTESTINAL, GENITOURINARY, SKIN, MUSCULOSKELETAL, HEMATOLOGIC  LYMPHATIC, ENDOCRINE METABOLIC:  All unremarkable.   EXAMINATION:  VITAL SIGNS:  Temperature 97.1, pulse 63, respiratory rate  20, blood pressure 112/78, O2 saturation on room air 98%.  GENERAL APPEARANCE:  Ronald Martin is a middle-aged male sitting up on  his hospital bed with no abnormal involuntary movements.   MENTAL STATUS EXAM:  Ronald Martin has good eye contact.  His affect is  mildly flat at baseline but with a broad and appropriate range.  His  attention span is normal, concentration slightly decreased, mood is  within normal limits.  He is oriented completely to all spheres.  Memory  3/3 visual objects immediate and 2/3 visual objects on recall.  Fund of  knowledge and intelligence are mildly below average.  Speech involves  mildly flat prosody, there is no dysarthria.  Thought process is  logical, coherent, goal-directed.  No looseness of associations.  Thought content:  No thoughts of harming himself or others.  He does  have the vague delusion described in the history of present illness.  He  has no hallucinations.  His insight is partial.  His judgment is intact  for standard social and treatment cooperation within a group home  setting.   ASSESSMENT:  Axis I:  1.  293.81 - psychotic disorder not otherwise  specified.  2.  295.70 - schizoaffective disorder, stable.  3.  Status  post lithium-induced delirium now resolved.  Axis II:  Deferred.  Axis III:  See past medical history.  Axis IV:  General  medical.  Axis V:  Global Assessment of Functioning 55.   Ronald Martin is not at risk to harm himself or others.  He agrees to  call emergency services immediately for any thoughts of harming himself,  thoughts of harming others or distress or hallucinations.   The undersigned provided ego-supportive psychotherapy and education.   The indications, alternatives and adverse effects of his psychotropic  medication were reviewed.   The risk of withdrawing lithium and mood destabilization was reviewed in  addition to the risk of recurrent lithium toxicity, seizures, and death.   The indications, alternatives and adverse effects of Lamictal were  discussed with the patient as a primary mood stabilizer including the  risk of lethal rash.   The patient understands the above information and at this time he would  like to continue with lithium and review the option of Lamictal with his  psychiatrist who he sees outside the hospital.   RECOMMENDATIONS:  1. Please see the above.  The undersigned will ask the social worker      to have release forms for the patient, to have the undersigned's      consult sent to [facility] psychiatric outpatient [facility].  2. Concur with assessment of thyroid status and treatment.  3. Regarding anti-psychosis, would restart his Haldol therapy for anti-      psychosis and anti-psychosis maintenance.   While on Haldol, would monitor for stiffness or other extrapyramidal  side effects.  He will require, as an outpatient, periodic abnormal  involuntary movement scales and hemoglobin A1c checks.   No change in Wellbutrin for anti-depression, no change in Valium for  anti-acute anxiety.   As discussed, Ronald Martin wants to continue his lithium.  Would  restart his lithium at 600 mg nightly with a recheck  of his lithium  blood level at a 12-hour draw 6 days after the lithium is started.  Would recheck a basic metabolic panel at that time as well.  The   laboratory results can be sent to his primary care physician as well as  his psychiatrist.   As mentioned above, he has considered his options and wants to continue  his lithium for now while potentially trying the Lamictal later under  the supervision of his outpatient psychiatrist.   When restarting the Haldol, would restart at 5 mg 2 times daily and then  titrate to his previous maintenance dosage which is 10 mg every morning  and 20 mg every evening.      Ronald Martin, M.D.  Electronically Signed     JW/MEDQ  D:  08/09/2008  T:  08/09/2008  Job:  [id number]

## 2010-09-25 NOTE — H&P (Signed)
NAME:  Ronald Martin, Ronald Martin NO.:  [id number]   MEDICAL RECORD NO.:  [id number]          PATIENT TYPE:  INP   LOCATION:  2606                         FACILITY:  [facility]   PHYSICIAN:  Eduard Clos, MDDATE OF BIRTH:  11-18-1955   DATE OF ADMISSION:  08/05/2008  DATE OF DISCHARGE:                              HISTORY & PHYSICAL   PRIMARY CARE PHYSICIAN:  Dr. Jennell Corner Souffront.   This is a 55 year old with past medical history of schizoaffective  disorder previously admitted in 2006 for acute lithium toxicity  secondary to NSAIDs also past medical history of hyperthyroidism, that  comes in for altered mental status.  As per nurse at skilled nursing  facility, the patient started complaining of gait abnormality and being  tremulous one week prior to admission.  Two days before starting the  symptoms, the patient had episodes of diarrhea which eventually resolved  by itself.  The symptoms of gait abnormality and being tremulous  eventually got worse with the adding of weakness to the point that the  patient could not walk or eat because of his weakness.  Today, he was  sent over because he was only alert and oriented to person and sluggish  and started to have tremors.  The patient was on no NSAIDs.   ALLERGIES:  To PENICILLIN unknown.   PAST MEDICAL HISTORY:  1. Schizoaffective disorder.  2. Diabetes type 2.  3. Hypertension.  4. Hypothyroidism.  5. COPD.  6. Hyperlipidemia.   MEDICATIONS:  1. Lithium carbonate 400 mg one tablet b.i.d.  2. Spiriva inhaler one cap day.  3. Lisinopril 20 mg one and half tablet daily.  4. Advair Diskus 1 puff b.i.d.  5. Coreg 3.125 one tablet b.i.d.  6. Simvastatin 10 mg daily.  7. Detrol LA 4 mg one tablet at bedtime.  8. Aspirin 81 mg one tablet p.o. daily.  9. Niaspan 500 mg three tablets nightly.  10.Ambien 10 mg nightly.  11.Metformin 1000 mg b.i.d.  12.Triamcinolone ointment three times a day p.r.n.  13.Glipizide 10  mg daily.  14.Topamax 15 mg nightly.  15.Gabapentin 300 mg t.i.d.  16.Amitriptyline 25 mg nightly.  17.Diazepam 5 mg t.i.d.  18.Albuterol, inhales 2 puff q.4 h p.r.n.  19.Vicodin 10/325 mg one tablet q.i.d.  20.Haldol 10 mg a.m., 20 mg p.m.  21.Wellbutrin 300 mg a.m.  22.Geodon 80 mg one tablet b.i.d.   FAMILY HISTORY:  Unable to obtain.   SOCIAL HISTORY:  Noncontributory.   REVIEW OF SYSTEMS:  As per nursing home nurse, no fever, no sick  contacts, no night sweats.  HEENT:  No sore throat.  No nose bleed.  CARDIOVASCULAR:  No complaining of chest pain.  No shortness of breath.  No PMD.  LUNGS:  No cough, no sputum, no hemoptysis.  ABDOMEN:  No  constipation.  No nausea or vomiting.  EXTREMITIES:  No edema.  NEURO:  No double vision.  No headaches.   PHYSICAL EXAMINATION:  VITAL SIGNS:  Temperature is 99.1, heart rate of  88, blood pressure of 101/43, respirations 20.  He was sating 95% on 2  L.  GENERAL  APPEARANCE:  He looks tremulous, but in no acute distress.  HEENT:  Dry mucous membrane, but no erythema on tonsils.  CARDIOVASCULAR:  He has regular rate and rhythm with a positive S1, S2.  No murmurs, rubs, or gallops.  LUNGS:  He has good air movement and clear to auscultation bilaterally.  ABDOMEN:  He has positive bowel sounds.  Nontender, nondistended, and  soft.  EXTREMITIES:  Positive pulses.  No edema.  NEUROLOGIC:  He is alert and oriented only to person, able to follow  simple commands, but cranial nerves were grossly intact and sensation is  grossly intact.  Muscle strength is 5/5 and symmetric in all 4  extremities.  His gait was not assessed and deep tendon reflex was 2+  and symmetrical.   LABORATORY DATA:  His sodium is 140, potassium is 3.8, chloride 114,  bicarb of 23, BUN of 45, creatinine of 3.4, and glucose of 42 and after  D50, it went up to 109.  LFTs are within normal limits.  White count is  9.3 with an ANC of 6.4, hemoglobin 11.6 with MCV of 96,  platelets were  261.  Alcohol level was less than 5, ammonium was less than 20.  UDS was  positive for opiate and urine showed 30 mg of protein.  His lithium  level was 2.59.  Chest x-ray shows central line in good position in the  SVC, with no acute cardiopulmonary disease.  His CT of the head showed  no changes in the appearance of the brain since the previous studies,  but it showed some mild ethmoidal sinusitis.  EKG showed normal sinus  rhythm with a right bundle-branch block.   ASSESSMENT AND PLAN:  1. Lithium toxicity.  His levels are only mildly elevated.  The      patient was having diarrhea 2 days prior to symptoms starting and      he was on an ACE which could be contributing to his dehydration      leading to acute Lithium intoxication.  We are also concerned for      nephrogenic diabetes insipidus as he has already hypothyroidism, so      we will check her a serum osmolality.  We will also check TSH,      check FE and hydrate aggressively.  We will stop lithium and check      lithium level in the morning.  This most likely seems chronic as      symptoms started Monday with gait and tremors.  The patient at this      point has no indication for HDS.  He is making good urine and his      symptoms are not severe but in the case that his lithium level does      not decrease by 0.6 in 24 hours, we will consider renal consult.      We will continue to hold the ACE and check a BMET q.12 h to monitor      for sodium for any sudden changes as sodium which can worsen his      altered mental status.  At this point, the patient has no acidosis,      which rules out RTA type 1 because his bicarb was 23.  We will use      Ativan for agitation p.r.n.  We will hold Neurontin and Wellbutrin      and other psych medications as they are mostly cleared renally  until sensorium clears.  In the morning we will get a psych      consult.  2. Acute renal failure. High BUN and creatinine, this  is probably a      combination of diarrhea and ACE, so we will hydrate and we will      check a BMET in the morning as above. We will use half normal      saline with D5 to avoid hypoglycemic episodes as the patient is on      glipizide, which is cleared renally.  3. Hyperchloremia consistent with gastrointestinal source for the      volume  depletion.  We will hydrate as above.  4. Hypertension.  We will hold all his blood pressure medications,      especially Coreg as lithium can cause significant bradycardia on      toxic levels.  5. Diabetes type 2.  His blood glucose and CBG was 36, D50 was given      it came up to 103, so he was started on half normal saline and  D5.      We will continue this.  We will check CBGs q.2 h.  We will hold      glipizide and metformin.  6. Schizoaffective disorder.  The patient on Topamax and Neurontin and      Elavil and Wellbutrin.  We will hold these.  We see him in the      morning if sensorium is clear.  The patient will need a psych      consult to address his psych medication as he is on multiple      medications for this which could be influencing his altered mental      status.  7. Chronic obstructive pulmonary disease.  The patient is currently      stable with no wheezing and good saturation.  We will continue his      current home meds.  8. Hyperlipidemia.  We will check fasting lipid panel in the morning.      We will continue Niaspan and Zocor.      Marinda Elk, M.D.  Electronically Signed      Eduard Clos, MD  Electronically Signed    AF/MEDQ  D:  08/05/2008  T:  08/06/2008  Job:  [id number]   cc:   Leanne Lovely, MD

## 2010-09-25 NOTE — H&P (Signed)
NAME:  Ronald Martin, Ronald Martin            ACCOUNT NO.:  [id number]   MEDICAL RECORD NO.:  [id number]          PATIENT TYPE:  IPS   LOCATION:  0404                          FACILITY:  [facility]   PHYSICIAN:  Anselm Jungling, MD  DATE OF BIRTH:  11-Feb-1956   DATE OF ADMISSION:  06/06/2008  DATE OF DISCHARGE:                       PSYCHIATRIC ADMISSION ASSESSMENT   DATE OF ASSESSMENT:  June 07, 2008 at 10:45 a.m.   IDENTIFICATION:  A 55 year old male.  This is an involuntary admission.   HISTORY OF PRESENT ILLNESS:  This is the fourth or fifth [facility] admission  for this 55 year old who was last on our service in April of 2005.  He  has a history of bipolar disorder and has been living at [facility].  He was brought to the emergency room by the group home staff.  Impulse disorder, and has a history of previous admissions to [facility], [facility] and [facility].  Four days of odd behavior, displaying some paranoia, talking a lot about  sexual fantasies with occasional verbal outbursts, yelling, becoming  intrusive, becoming increasingly paranoid, and they feared for his  safety.  His group home supervisor reported that he had been taking all  of his medications and has no current issues with substance abuse.   PAST PSYCHIATRIC HISTORY:  Previously followed at [facility].  He is apparently followed at his group home for psychiatric  services with an unknown Kerin Kren.  This is his fourth or fifth [facility]  admission, with his last admission here August 19, 2003 to August 26, 2003  on the service of Dr. Kathrynn Running for bipolar disorder.  He has a history of  prior admissions also at [facility] and [facility].  He was previously active in [employer] many years ago.  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   SOCIAL HISTORY:  Single  Caucasian male who lives at [facility] home  in [place].  No known legal charges, and no evidence of current  substance abuse.   FAMILY HISTORY:  Unavailable.   MEDICAL HISTORY:  Primary care practitioner appears to be the group home  medical staff who is Dr. Ma Rings, with a given phone number of [phone].   MEDICAL PROBLEMS:  1. Diabetes mellitus type 2 diabetes.  2. COPD.  3. Dyslipidemia.   PAST MEDICAL HISTORY:  Significant for an episode of lithium toxicity  with acute renal failure that coincided with an episode of diarrhea in  2006.   CURRENT MEDICATIONS:  Current medications were validated with the  medication administration record from [facility] assisted living.  1. Gabapentin 300 mg three times a day.  2. Bupropion 150 mg XL; appears to be recently discontinued.  3. Amitriptyline 25 mg q.h.s.  4. Diazepam 5 mg t.i.d.; unclear if this was recently discontinued.  5. Detrol LA 4 mg q.a.m.  6. Lisinopril 20 mg q.a.m.  7. Glyburide 5 mg q.a.m.  8. Hydrocodone 10/500 mg  1 tablet four times a day p.r.n. for pain,      which had recently been discontinued.  9. Lithium carbonate recently decreased from 450 mg ER to 300 mg p.o.      daily.  10.Citalopram 20 mg daily started at the beginning of January.  11.Spiriva HandiHaler 1 inhalation q.a.m.  12.Advair discus 250/50 one puff twice a day.  13.Carvedilol 3.125 mg 1 tablet twice a day.  14.Simvastatin 10 mg daily at bedtime.  15.Aspirin 81 mg daily.  16.Niaspan 500 mg ER 3 tablets by mouth daily at bedtime.  17.Tricor 145 mg.  18.Triamcinolone ointment to facial rash as needed.  19.Topiramate 50 mg q.a.m.  20.Stannous fluoride gel to apply to teeth daily.  21.Ventolin inhaler 2 puffs every 4 hours as needed for shortness of      breath or asthma.  22.Zolpidem 10 mg h.s. p.r.n. insomnia, recently discontinued.  23.Lactulose 15 mL daily p.r.n. constipation.   ALLERGIES:  PENICILLIN.   PHYSICAL  EXAMINATION:  Physical exam done in the emergency room as noted  in the record.  GENERAL:  This is an obese gentleman who was confused and rather  agitated in the emergency room and has required seclusion at his request  in order to stay calm here.  VITAL SIGNS:  At the time of admission, temperature 99.1, pulse 77,  respirations 24, blood pressure 156/86.   DIAGNOSTIC STUDIES:  CBC within normal limits.  Hemoglobin 13.9,  hematocrit 41.7, platelets 310,000.  Alcohol level less than 5.  Chemistry normal other than a mildly elevated random glucose at 118.   MENTAL STATUS EXAM:  A well-nourished, well-developed, overweight male  who is in our seclusion room with one-to-one observation, alert,  hypervigil, oriented x4, appears fearful, asking to be left alone.   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  AXIS III:  Diabetes mellitus type 2, dyslipidemia.  AXIS IV:  Deferred.  AXIS V:  Current 28, past year not known.   PLAN:  The plan is to continue his current medications.  We are going to  restart his Valium 5 mg b.i.d. and h.s.  Will also increase his lithium  to 450 mg daily, and he is on one-to-one observation in our intensive  care unit.  Will continue his routine medications.      Margaret A. Lorin Picket, N.P.      Anselm Jungling, MD  Electronically Signed    MAS/MEDQ  D:  06/13/2008  T:  06/13/2008  Job:  [id number]

## 2010-09-25 NOTE — Discharge Summary (Signed)
NAME:  Ronald Martin, Ronald Martin NO.:  [id number]   MEDICAL RECORD NO.:  [id number]          PATIENT TYPE:  INP   LOCATION:  5024                         FACILITY:  [facility]   PHYSICIAN:  Renee Ramus, MD       DATE OF BIRTH:  06-Apr-1956   DATE OF ADMISSION:  08/05/2008  DATE OF DISCHARGE:                               DISCHARGE SUMMARY   PRIMARY DISCHARGE DIAGNOSIS:  1. Lithium toxicity.   SECONDARY DIAGNOSES:  Include:  1. Schizoaffective disorder.  2. Acute renal failure.  3. Hypertension.  4. Diabetes type 2, controlled.  5. Chronic obstructive pulmonary disease.  6. Hypothyroid.  7. Chronic low back pain.  8. Hypercholesterolemia.  9. Hyperlipidemia.   HOSPITAL COURSE BY PROBLEM:  1. Acute lithium toxicity.  The patient is a 55 year old male who was      admitted for lithium toxicity.  He has had multiple admissions for      this in the past.  He lives in assisted living.  He was seen by      psychiatry in consult.  His toxicity is now resolved.  He is being      placed back on lithium with instructions to get a follow-up trough      dose 6 days from now.  This be arranged for him and then to follow      up with his psychiatrist as an outpatient, as well as his      outpatient physician.  The patient received a large amount of IV      fluid.  His initial lithium level was 2.59.  This decreased.  He      does not have evidence of diabetes insipidus.  He is now thought to      be stable for discharge.  2. Schizoaffective disorder.  This is stable.  Psychiatry has made      adjustments and recommendations for medications.  These will be      followed and he will follow up with his outpatient psychiatrist  3. Hypertension.  This is relatively well-controlled.  He will      continue on his lisinopril post discharge.  4. Diabetes mellitus type 2, well-controlled.  He will continue on his      Glucophage post discharge.  5. COPD, currently stable.  He will continue on  his Advair and      Singulair.  He does not appear to require oxygen at this time.  6. Hyperlipidemia.  The patient will continue statin therapy.  7. Chronic low back pain.  This is stable.  He will continue his      outpatient pain medications for this.  8. Hypothyroid.  The patient will continue with levothyroxine.   LABS OF NOTE:  1. Anemia with hemoglobin of 10.6, hematocrit of 31 with MCV of 96,      platelets of 210.  2. Glucose ranging from 171-103.  3. Initial elevation in creatinine with a BUN of 45, creatinine 3.43.      This decreased to BUN of 7 and creatinine of 1.11.  4. Calcium 9.3.  5.  Lithium level initially toxic at 2.59, decreasing to 1.   STUDIES.:  1. CT head without contrast shows no acute disease, except for      presence of likely sinusitis.  2. Chest x-ray showing no acute disease.   MEDICATIONS ON DISCHARGE:  1. Lithium 6 mg p.o. nightly.  2. Haldol 5 mg p.o. q.a.m. and 10 mg p.o. nightly.  3. Zocor at 10 mg p.o. daily.  4. Coreg 3.125 mg p.o. b.i.d.  5. Aspirin which has been discontinued.  6. Niaspan ER 1500 mg p.o. nightly which has been discontinued.  7. Ambien 10 mg p.o. nightly which was discontinued.  8. Topamax 100 mg p.o. daily which has been discontinued.  9. Lithium carbonate 450 mg ER p.o. b.i.d. which was discontinued.  10.Gabapentin 900 mg p.o. nightly which has been discontinued.  11.Diazepam 2 mg p.o. q.8h. which has been discontinued.  12.Vicodin 5/325 one to two tablets p.o. q.6h. p.r.n. pain.  13.Lactulose 10 grams p.o. daily which has been discontinued.  14.Wellbutrin XL 300 mg p.o. daily which has been discontinued.  15.TriCor 145 mg p.o. daily.  16.Glyburide 5 mg p.o. daily.  17.Geodon 80 mg p.o. daily which has been discontinued.  18.Spiriva 1 capsule inhaled daily.  19.Lisinopril 20 mg p.o. daily.  20.Advair Diskus 250/50 one inhalation b.i.d.   There were no labs or studies pending at the time of discharge.   The patient  is in stable condition and anxious for discharge.   Time spent 35 minutes.      Renee Ramus, MD  Electronically Signed     JF/MEDQ  D:  08/09/2008  T:  08/09/2008  Job:  [id number]   cc:   Wilfredo Souffront, Dr.

## 2010-09-25 NOTE — Group Therapy Note (Signed)
NAME:  Ronald Martin, [name]:  [id number]   MEDICAL RECORD NO.:  [id number]          PATIENT TYPE:  INP   LOCATION:  5024                         FACILITY:  [facility]   PHYSICIAN:  Lonia Blood, M.D.DATE OF BIRTH:  1956-01-13                                 PROGRESS NOTE   PRIMARY CARE PHYSICIAN:  Unassigned to [facility].   ACTIVE DIAGNOSES AT PRESENT TIME:  1. Lithium toxicity - improving with ongoing hydration.  2. Acute renal failure - resolved.  3. Hypertension - borderline hypotension at the present time - ongoing      hydration continued.  4. Diabetes - stable.  5. Schizoaffective disorder - lithium discontinued - further      medication recommendation per psychiatric consult which is pending.  6. Chronic obstructive pulmonary disease - well compensated.  7. Hypothyroidism - Synthroid on hold due to low TSH.  8. Chronic low back pain.   DISCHARGE MEDICATIONS:  To be determined at the actual time of the patient's discharge.   CONSULTATIONS:  Psychiatry consultation - pending.   PROCEDURE:  CT scan of the head August 05, 2008 - Note a change in the appearance of  the brain since previous study.   HOSPITAL COURSE:  Ronald Martin is a 55 year old gentleman with a history of known  severe schizoaffective disorder and which has required multiple  medications for control in the past.  He resides at the local assisted-  living facility.  He has been admitted to the hospital for lithium  toxicity previously.  It was advised at that time that the patient  should not take any potential nephrotoxic agents, to include NSAIDs or  ACE inhibitors, in conjunction with his lithium.  Apparently, the  patient has been back on his lithium for some time, as it was resumed  shortly following his treatment for toxicity back in 2006.  Two days  prior to the patient's admission to the hospital he began to suffer with  episodes to include diarrhea as well as  unsteady gait.  He was also  found to be quite tremulous.  These symptoms worsened over the 48 hours  prior to his admission.  On the date of his admission, he began to be  quite sluggish.  He was transferred to [facility] emergency room for  evaluation of his altered mental status.  At that time, he was found to  be in acute renal failure with a creatinine of 3.4 and was also found to  have a lithium level of 2.59.  The patient was admitted into acute  units.  He was aggressively hydrated.  Lithium was held.  Potential  nephrotoxins, to include the ACE inhibitor that he was on, were  discontinued.  With hydration, the patient's lithium level steadily  decreased.  His renal function also normalized with simple hydration.  It is recommended that he not be dosed with ACE inhibitors or non-  steroidals in the future due to his propensity for acute renal failure  with these medications in combination with his lithium.  Given that this  is at minimum the patient's second  episode of severe lithium toxicity,  it has been decided that he is likely a very poor candidate for ongoing  lithium therapy.  Due to the complexity of his schizoaffective disorder,  however, it was not felt to be safe to simply discontinue this  medication.  At the present time, we are awaiting a psychiatry  consultation to evaluate the patient's clinical situation and to suggest  alternative agents to lithium.  The patient's blood pressure is  presently somewhat borderline low and we, therefore, continue to hydrate  him.  It is anticipated the patient will be clear for discharge and able  to return back to his assisted-living facility within the next 24 hours.  At that time, an addendum will be dictated to assure that the patient's  accurate discharge medications are reflected.      Lonia Blood, M.D.  Electronically Signed     JTM/MEDQ  D:  08/08/2008  T:  08/08/2008  Job:  [id number]

## 2010-09-25 NOTE — Discharge Summary (Signed)
NAME:  Ronald Martin, HAIL NO.:  [id number]   MEDICAL RECORD NO.:  [id number]          PATIENT TYPE:  IPS   LOCATION:  0403                          FACILITY:  [facility]   PHYSICIAN:  Anselm Jungling, MD  DATE OF BIRTH:  09/13/1955   DATE OF ADMISSION:  09/20/2008  DATE OF DISCHARGE:  10/03/2008                               DISCHARGE SUMMARY   IDENTIFYING DATA AND REASON FOR ADMISSION:  This was an inpatient  psychiatric admission for [name], a 55 year old unmarried male, who was  admitted due to severe decompensation of his bipolar affective disorder.  Please refer to the admission note for further details pertaining to the  symptoms, circumstances, and history that led to his hospitalization.  He was given an initial Axis I diagnosis of bipolar disorder, NOS.   MEDICAL AND LABORATORY:  The patient was medically and physically  assessed by the psychiatric nurse practitioner.  He came to [place] with a  history of COPD/asthma, hypertension, hyperlipidemia, diabetes mellitus,  and chronic pain.  He was continued on his usual regimen of Advair,  Zocor, Coreg, Spiriva, Tricor, glyburide, lisinopril, and hydrocodone.  There were no significant medical issues during his hospital stay.  His  CBGs ran in a range of 95 to 112.   HOSPITAL COURSE:  The patient was admitted to the adult inpatient  psychiatric service.  He presented as a well-nourished, normally  developed, adult male with significant thought disturbance and appearing  to be responding to internal stimuli.  His affect was moderately  blunted.  He was generally cooperative and redirectable.  He had a  reasonable degree of insight, understanding the reasons for his hospital  stay, and accepting of being in this inpatient setting.   He was treated with a psychotropic regimen of Depakote and Haldol.  His  Depakote level was adjusted to a final level of 102 on a dose of 2000 mg  daily.  Because he showed some somnolence the  2 days following this, his  dose upon discharge was reduced to 1500 mg daily.   The patient was generally pleasant and cooperative although quite  blunted and late throughout his hospital stay.   We were in touch with his group home, [facility], and they indicated all  along that they would accept him back once stabilized.   The patient was appropriate for return to [facility] after 15 days.  The  following aftercare arrangements were made.   AFTERCARE:  The patient was to follow up at [facility] with an appointment to see their psychiatrist on October 20, 2008,  at 1 p.m.   DISCHARGE MEDICATIONS:  1. Advair Diskus 1 puff twice daily.  2. Zocor 10 mg daily.  3. Coreg 3.125 mg twice daily.  4. Depakote 1500 mg q.h.s.  5. Spiriva 18 mcg 1 puff daily.  6. Tricor 145 mg daily.  7. Glyburide 5 mg q.a.m.  8. Lisinopril 5 mg q.a.m.  9. Haldol 5 mg b.i.d. and 10 mg q.h.s.  10.Hydrocodone APAP 5/325 one to two every 4 hours p.r.n. pain.   The patient was  instructed to stop taking lithium, amitriptyline, and  Wellbutrin.  Lithium was discontinued because of a previous history of  multiple instances of lithium toxicity.  Amitriptyline and Wellbutrin  were discontinued because of concerns of activation of mania.   DISCHARGE DIAGNOSES:  AXIS I:  Bipolar affective disorder, not otherwise  specified.  AXIS II:  Deferred.  AXIS III:  History of chronic obstructive pulmonary disease/asthma,  hypertension, hyperlipidemia, diabetes mellitus, chronic pain.  AXIS IV:  Stressors severe.  AXIS V:  Global Assessment of Functioning on discharge 50.      Anselm Jungling, MD  Electronically Signed     SPB/MEDQ  D:  10/03/2008  T:  10/03/2008  Job:  [id number]

## 2010-09-28 NOTE — Discharge Summary (Signed)
[facility]  Patient:    Ronald Martin, Ronald Martin Visit Number: [id number] MRN: [id number]          Service Type: EMS Location: [place] Attending Physician:  Nelia Shi Dictated by:   Reymundo Poll Dub Mikes, M.D. Admit Date:  03/09/2001 Discharge Date: 03/09/2001                             Discharge Summary  CHIEF COMPLAINT AND PRESENT ILLNESS:  This was the second admission to [facility] for this 55 year old male, who [facility] the [facility] as recommended by his sponsor.  He apparently had taken a bunch of Klonopin, presented with slurred speech, was sent back to the emergency room for clearance.  Has a history of bipolar disorder.  Apparently, he was having an argument with the sponsor and this escalated to his taking the medication. Upon admission, there were flight of ideas, some paranoia as well as racing thoughts.  There was some confusion as well as tangential thinking.  Denied any auditory or visual hallucinations.  Denied any suicidal or homicidal ideation.  Claimed he had not relapsed on use of alcohol or any other substances.  Been sleeping on Seroquel 200 mg and Sonata 10 mg.  Has been taking Xanax as prescribed.  He denied that he abuses it.  Continues to use Ephedra in order to wake up in the morning.  Did state that he was using too much medication.  PAST PSYCHIATRIC HISTORY:  [facility].  Last seen patient June 24th to July 1st.  ALCOHOL/DRUG HISTORY:  Use of Ephedra as well as Xanax.  Before it was Ativan. He was taking Xanax up to 2 mg four times a day as prescribed by his family physician.  ADMISSION MEDICATIONS:  Lithium 300 mg twice a day, Seroquel 100 mg in the morning and 200 mg at bedtime, Glucotrol XL 5 mg every day, Xanax 2 mg four times a day, Ephedra.  PHYSICAL EXAMINATION:  Performed at the emergency room and failed to show any acute findings.  MENTAL STATUS EXAMINATION:   Upon admission revealed a disheveled white male in bed, drowsy.  Respirations were initially labored but then he was breathing easily within 15-20 minutes.  Afterwards, fully alert and cooperative.  The speech was somewhat garbled but he was fully alert.  At times, difficult to understand.  Had some mild pressure of speech.  His mood was anxious, at times defensive and guarded.  The thought processes were tangential.  Very superficial insight.  Somewhat suspicious of other people.  Cognition intact.  ADMISSION DIAGNOSES: Axis I:    1. Bipolar disorder.            2. 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            3. Ephedrine abuse. Axis II:   Deferred. Axis III:  1. Asthma by history.            2. Diabetes mellitus, type 2. Axis IV:   Moderate. Axis V:    Global Assessment of Functioning upon admission 35; highest Global            Assessment of Functioning in the last year 60.  LABORATORY DATA:  CBC was within normal limits.  The thyroid was within normal limits.  Lithium level was 0.5.  HOSPITAL COURSE:  He was admitted and started intensive individual and group psychotherapy.  He was basically  detoxed from his Xanax and encouraged not to go back to get prescriptions for Xanax as we have detoxed him previously from Ativan and now he was up to 8 mg of Xanax and he was willing to do so.  We went ahead and continued the lithium 300 mg twice a day and the Seroquel 100 mg in the morning and 200 mg at night.  Lithium was increased to 600 mg twice a day and Seroquel to 200 mg in the morning and 200 mg at bedtime.  He was given some trazodone for sleep.  Seroquel was then increased to 100 mg in the morning and 300 mg at bedtime.  He was given Ambien.  He was given a trial of Lexapro but, since he could not tolerate it, he was discontinued. Gradually, he settled down.  He was able to look into the events that led to the hospitalization.  He was willing and motivated to continue to  work on his recovery.  On February 02, 2001, he was much improved.  Mood euthymic. Affect brighter with no Xanax.  Willing and motivated to pursue further outpatient follow-up.  DISCHARGE DIAGNOSES: Axis I:    1. Bipolar disorder, depressed.            2. Benzodiazepine abuse.            3. Ephedrine abuse. Axis II:   No diagnosis. Axis III:  1. Asthma.            2. Diabetes mellitus, type 2. Axis IV:   Moderate. Axis V:    Global Assessment of Functioning upon discharge 50.  DISCHARGE MEDICATIONS: 1. Glucotrol XL 5 mg daily. 2. Albuterol inhaler as needed. 3. Lithium carbonate 600 mg twice a day. 4. Seroquel 100 mg in the morning and 300 mg at bedtime. 5. Trazodone 150 mg at bedtime.  FOLLOW-UP:  [facility].  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 Dictated by:   Reymundo Poll Dub Mikes, M.D. Attending Physician:  Nelia Shi DD:  03/15/01 TD:  03/17/01 Job: [id number]
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
## 2010-09-28 NOTE — Discharge Summary (Signed)
NAME:  Ronald Martin, Ronald Martin                      ACCOUNT NO.:  [id number]   MEDICAL RECORD NO.:  [id number]                   PATIENT TYPE:  IPS   LOCATION:  0506                                 FACILITY:  [facility]   PHYSICIAN:  Geoffery Lyons, M.D.                   DATE OF BIRTH:  1955/10/15   DATE OF ADMISSION:  08/19/2003  DATE OF DISCHARGE:  08/26/2003                                 DISCHARGE SUMMARY   CHIEF COMPLAINT AND PRESENT ILLNESS:  This was the second or third admission  to [facility] for this 55 year old single white  male voluntarily admitted.  He had a history of depression, suicidal  ideation for three to four days, no specific plan.  He was noncompliant with  medications.  He did not follow up.  He felt that medications were making  him too sedated.  He was taking ephedra, a few packets at a time.  He denied  psychosis.  He endorsed suicidal ideation but able to promise safety.   PAST PSYCHIATRIC HISTORY:  He had been at [facility] a few  times.  He had been at [facility] and [facility].  No  ongoing outpatient treatment.   SUBSTANCE ABUSE HISTORY:  He denied the use or abuse of any substances.   PAST MEDICAL HISTORY:  1. Diabetes mellitus type 2 controlled by diet.  2. Back pain.   MEDICATIONS:  Off for a year: Lithium, Seroquel, Wellbutrin.   PHYSICAL EXAMINATION:  Physical examination was performed, failed to show  any acute findings.   LABORATORY DATA:  Hemoglobin A1c 8.7.  Thyroid profile: TSH 0.26, T4 0.29.   MENTAL STATUS EXAM:  Mental status exam revealed an alert, middle-aged male,  somewhat unkempt, minimal eye contact.  Speech: Clear, normal production and  tempo.  Mood: Down.  Affect: Constricted.  Thought processes: Coherent; no  evidence of psychosis, no delusions, no evidence of hallucinations.  He  endorsed suicidal ruminations but could contract for safety.  Cognitive:  Cognition was well  preserved.   ADMISSION DIAGNOSES:   AXIS I:  Bipolar disorder, depressed.   AXIS II:  No diagnosis.   AXIS III:  1. Leg pain.  2. Back pain.   AXIS IV:  Moderate.   AXIS V:  Global assessment of functioning upon admission 30, highest global  assessment of functioning in the last year 55-60.   HOSPITAL COURSE:  He was admitted and started in intensive individual and  group psychotherapy.  We went ahead and gave him some Ambien for sleep.  He  was given Seroquel 100 mg at night, Wellbutrin XL 150 mg in the morning.  Seroquel was increased to 200 mg at night.  He was given some Flexeril and  Motrin for his back pain.  Internal medicine was consulted.  He was  restarted on lithium 300 mg twice a day and then later to  300 mg three times  a day.  He was placed on Amaryl up to 4 mg in the morning and then decreased  to 2 mg daily.  He did endorse depression, insomnia, some vague passive  suicidal ideation.  He endorsed that he was on the depressive side of  bipolar.  There was some thought blocking, dysphoria, blunted affect.  We  continued to work with the Seroquel.  One of the stressors had to do with  not having a place to go.  He was evidencing psychomotor retardation,  insomnia, some anxiety.  As he was started back on his medications, there  was improvement.  We up to 300 mg of lithium three times a day.  He started  sleeping better with the Seroquel and he did endorse that his moods were  feeling better.  He endorsed no suicidal or homicidal ideation.  On April  15, he was in full contact with reality.  There were no suicidal ideas, no  homicidal ideas, no hallucinations, no delusions.  We felt the medications  were helping him and he, himself, was feeling better and objectively, he  looked better.  As he was not suicidal or homicidal, we went ahead and  discharged to outpatient followup.   DISCHARGE DIAGNOSES:   AXIS I:  Bipolar disorder, depressed.   AXIS II:  No  diagnosis.   AXIS III:  1. Leg pain.  2. Back pain.  3. Diabetes mellitus type 2.   AXIS IV:  Moderate.   AXIS V:  Global assessment of functioning upon discharge 55-60.   DISCHARGE MEDICATIONS:  1. Wellbutrin XL 150 mg in the morning.  2. Seroquel 200 mg at night.  3. Flexeril 10 mg three times a day.  4. Lithium 300 mg three times a day.  5. Amaryl 4 mg daily.  6. Ambien 10 mg at night as needed for sleep.   FOLLOW UP:  He had a followup appointment at [facility] and Olene Craven, M.D., to follow up with diabetes and blood  pressure.                                               Geoffery Lyons, M.D.    IL/MEDQ  D:  09/20/2003  T:  09/21/2003  Job:  [id number]

## 2010-09-28 NOTE — H&P (Signed)
[facility]  Patient:    Ronald Martin, Ronald Martin Visit Number: [id number] MRN: [id number]          Service Type: Attending:  Reymundo Poll. Dub Martin, M.D. Dictated by:   Ronald Martin, N.P. Adm. Date:  01/27/01                     Psychiatric Admission Assessment  DATE OF ADMISSION:  January 27, 2001.  IDENTIFYING INFORMATION:  This is a 55 year old single white male who is voluntary.  He was screened in the emergency room for slurred speech and reportedly "using too much medication."  HISTORY OF THE PRESENT ILLNESS:  This 55 year old male with a history of bipolar disorder reports having an argument with his Ronald Martin yesterday who felt that the patients behavior was in some way unreasonable.  The patient displayed flight of ideas in the emergency room along with some paranoia, and today he reports that he has been having racing thoughts.  He continues to be a bit confused today and quite tangential, but he denies any auditory or visual hallucinations.  He denies any suicidal ideation or homicidal ideation.  He denies any substance abuse or ETOH abuse. He reports he has been sleeping well on Seroquel 200 mg and Sonata 10 mg at h.s.  He also admits that he has been taking Xanax as prescribed below.  He denies that he abuses it.  He reports that he continues to use Ephedra in order to wake up in the morning.  Patient states he was using "too much medication" last night at the time of admission, but denies this this morning and he was never specific last night.  However, because of his slurred speech he was sent to the emergency room and treated with activated charcoal.  He continues to be initially drowsy this morning, and then is more alert after we were able to wake him up and get him sitting up.  He was then alert for the interview.  PAST PSYCHIATRIC HISTORY:  Patient is followed at [facility] and states he has been  compliant with his appointments.  His last inpatient admission was at [facility] from June 24 to July 1 for polysubstance abuse with Ativan and ephedrine and history of bipolar disorder.  Patient has a patient of other prior admissions in [place].  SOCIAL HISTORY:  Patient is single, never married.  He has no children.  He lives in a rooming house for recovering alcoholics.  He has been recently fired from his job.  He plans on returning to the rooming house where he currently resides.  FAMILY HISTORY:  Noncontributory.  ALCOHOL AND DRUG HISTORY:  Patient reports continued use of Ephedra but is not specific about how much he is taking, and he uses this to wake up in mornings. He also reports taking Xanax 2 mg q.i.d. and his UDS was positive for benzodiazepines although he denies abusing this and states that he is using it the way that it was prescribed by his family care physician.  PAST MEDICAL HISTORY:  Patient is followed by Dr. Feliciana Martin, M.D., in [facility], who is his primary care Ronald Martin.  Medical problems are diabetes mellitus type 2, asthma, according to his past records, and he uses no inhalers currently.  MEDICATIONS: 1. Lithium 300 mg p.o. b.i.d. 2. Seroquel 100 in the a.m., 200 mg at h.s. 3. Glucotrol  XL 5 mg p.o. q.d. 4. Xanax 2 mg  p.o. q.i.d. 5. Ephedra unknown amount to wake up in the morning.  DRUG ALLERGIES:  PENICILLIN.  POSITIVE PHYSICAL FINDINGS:  Patients PE was done in the emergency room at [facility] where he was treated with activated charcoal.  It was noted that his urine drug screen was positive for benzodiazepines.  His pulse was 99, blood pressure 136/100 in the emergency room, and respirations 20.  His CBC was essentially normal.  His total white count was normal although his monocytes were 0.9 and lymphocytes were 3.4.  His alcohol level was less than 5.  He did have a CT scan which showed mild atrophy  and some mild paranasal sinus disease, but no acute changes.  His glucose was 125 in the emergency room.  This morning, he was quite drowsy initially and his respirations appeared labored.  His pulse ox however was initially 84% when we checked it, and then corrected on recheck to 95% approximately 10 minutes later.  His vital signs this morning are as follows:  His temp is 98.3, pulse 83, respirations 20, blood pressure 128/82.  He is approximately 5 feet 8 inches tall and  weighs 242 pounds.  His lithium level and his thyroid panel are currently pending.  MENTAL STATUS EXAMINATION:  This is a disheveled, obese male, whose head is shaved.  He is in bed, in a urine soaked bed.  He was quite drowsy initially. We took some time to wake him up and check his vital signs and he came around fine.  His respirations seemed initially somewhat labored, then he was breathing easily within about 15 or 20 minutes, such that during the interview he was fully alert.  He is cooperative.  His speech is a little bit garbled and difficult to understand, which is primarily due to the fact that he is missing his front teeth.  Speech did show some mild pressure.  His mood is fairly euthymic.  He is a bit defensive at times and a bit guarded.  Thought process is tangential.  He is mildly paranoid, feeling people are against him, particularly yesterday, and that they were unreasonable to want him to come into the hospital.  His insight is definitely impaired.  No evidence of suicidal ideation or homicidal ideation.  No evidence of auditory or visual hallucinations.  Cognitively, he is intact x 3 although he continues to be confused about various details such as when and where he gets his medications filled.  He also displayed some confusion last night in the emergency room, thinking that Ronald Martin was currently president.  However, he is oriented x 3 today.  ADMISSION DIAGNOSES: Axis I:    1. Bipolar disorder.             2. Rule out Benzodiazepine abuse.            3. Ephedrine stimulant abuse. Axis II:   Deferred.  Axis III:  Asthma by history, diabetes mellitus type 2, rule out respiratory            infection. Axis IV:   Deferred. Axis V:    Current 40, past year 57.  INITIAL PLAN OF CARE:  To voluntary admit the patient to  stabilize his mood and clear his thinking, with our goal to improve his reality testing and ensure he can function safely in the community.  Because his respirations were a bit labored earlier today, we are going to check his vital signs q.i.d. x 24 hours, along with temp just to  make sure he did not aspirate any charcoal yesterday, and we are going to hold off on any benzodiazepines except on a p.r.n. basis for now.  We will otherwise restart his routine medications and omit the Xanax.  We will add an albuterol inhaler on a p.r.n. basis if he feels short of breath again.  We will check a lithium level on him in the morning, and we will continue his current dose of lithium.  For his diabetes, we are going to continue his Glucotrol XL and we will check his CBGs b.i.d. We will also go ahead and continue his current dose of Seroquel which is 100 mg q.a.m. and 200 mg q.h.s. and titrate from there if we need to to control his paranoia.  We will also ask the nurse to contact Dr. Ova Freshwater office for his current medications.  ESTIMATED LENGTH OF STAY:  Two to four days. Dictated by:   Ronald Martin, N.P. Attending:  Reymundo Poll Dub Martin, M.D. DD:  01/28/01 TD:  01/28/01 Job: [id number]

## 2010-09-28 NOTE — Discharge Summary (Signed)
NAME:  Ronald Martin, Ronald Martin            ACCOUNT NO.:  [id number]   MEDICAL RECORD NO.:  [id number]          PATIENT TYPE:  INP   LOCATION:  4740                         FACILITY:  [facility]   PHYSICIAN:  Hettie Holstein, D.O.    DATE OF BIRTH:  [birth year]   DATE OF ADMISSION:  06/19/2004  DATE OF DISCHARGE:  06/22/2004                                 DISCHARGE SUMMARY   PRIMARY CARE PHYSICIAN:  Olene Craven, M.D.   PRIMARY PSYCHIATRIST:  Geoffery Lyons, M.D. and Jeanice Lim, M.D.   ADMISSION DIAGNOSES:  1.  Lithium toxicity.  2.  Acute renal insufficiency.  3.  Lethargy secondary to the above.   DISCHARGE DIAGNOSES:  1.  Lithium toxicity.  2.  Acute renal insufficiency, thought to be secondary to a combination of      nonsteroidal anti-inflammatories in addition to ACE inhibitors, mild      volume depletion with diarrhea, possible polyuria with diuretic and      possibly lithium.  It is recommended by consultative services by Dr.      Kathrynn Running, that the patient avoid HCTZ in combination with lithium.  3.  Lethargy secondary to the above.  4.  Hypertension,  has remained under good control during this      hospitalization, recommend follow up in the outpatient setting and re-      initiation of his antihypertensive medications.  If he does become      hypertensive, however, would refrain from a combination of nonsteroidal      anti-inflammatories with ACE inhibitor or ARB.  5.  Diabetes mellitus, controlled during this hospitalization with re      initiation of oral pharmacotherapy.  6.  Bipolar disorder, remained stable this admission.  7.  Tobacco dependence.   DISCHARGE MEDICATIONS:  The patient's medications were adjusted by his  psychiatrist, Dr. Kathrynn Running.  1.  Elavil 50 mg p.o. every 9 p.m.  2.  Eskalith CR 450 mg p.o. every 9 p.m.  3.  Wellbutrin XL 150 mg p.o. every day.  4.  Valium 5 mg every 7 a.m., 2 p.m., and 9 p.m.  5.  Restoril 50 mg p.o. q.h.s. p.r.n.  6.   Cogentin 0.5 mg p.o. b.i.d.  7.  Valium 5 mg p.o. q.8h. p.r.n. anxiety.  8.  Ambien 10 mg p.o. q.h.s. p.r.n.  9.  Glucophage 500 mg p.o. b.i.d.  10. Advair 250/50, one puff b.i.d.  11. Lipitor 10 mg p.o. q.h.s.  12. Soma 350 mg p.o. q.i.d.   DISPOSITION:  1.  He is instructed to have followup lithium level on June 26, 2004,      with the results being reviewed by his psychiatrist Dr. Kathrynn Running in      addition to his primary care physician, Dr. Barbee Shropshire.  2.  In addition, he should follow up with his primary care physician, Dr.      Barbee Shropshire, within 1-2 weeks of hospital discharge.  He is instructed to      call to schedule this appointment once he is discharged.   At this time, it is anticipated he will  return to [facility] where he was  residing previously.   HISTORY OF PRESENT ILLNESS:  Please refer to H&P as dictated by Dr. Gertha Calkin, however briefly, [name] is a 55 year old Caucasian male  with a history of bipolar disorder, diabetes, hypercholesterolemia, COPD,  and tobacco abuse, and a history of suicidal ideation in the past who  presented with lethargy and low glucose from [facility].  He  was seen in the emergency department confused and combative.   HOSPITAL COURSE:  He was initially, as noted above, confused and combative  though he did calm throughout his  hospital course.  He did appear lethargic  initially.  He was noted to be in acute renal failure.  He was given IV  fluids.  His hydrochlorothiazide, ACE inhibitor, and NSAIDs were held.  His  lithium level on presentation was 2.65 that came down to 1.14, and he was  reinitiated on his lithium therapy per Dr. Kathrynn Running.  His initial basic  metabolic panel revealed a creatinine of 2.3 that did correct to a  creatinine of 1.4 with the above interventions.  His course was otherwise  uneventful.  He did quite well without complication.   LABORATORY DATA:  As follows, on the day of discharge  the patient's  metabolic panel revealed a sodium of 139, potassium 4.2, BUN 12, creatinine  1.1.  His CBC revealed a WBC of 2.4, hemoglobin 11.5, platelet count of 276.      ESS/MEDQ  D:  06/22/2004  T:  06/22/2004  Job:  [id number]   cc:   Olene Craven, M.D.  [address]  [address]  [place]  [place] [address]  Fax: [phone]   Jeanice Lim, M.D.   Geoffery Lyons, M.D.

## 2010-09-28 NOTE — H&P (Signed)
NAME:  Ronald Martin, Ronald Martin            ACCOUNT NO.:  [id number]   MEDICAL RECORD NO.:  [id number]          PATIENT TYPE:  INP   LOCATION:  1825                         FACILITY:  [facility]   PHYSICIAN:  Gertha Calkin, M.D.DATE OF BIRTH:  [birth year]   DATE OF ADMISSION:  06/20/2004  DATE OF DISCHARGE:                                HISTORY & PHYSICAL   PRIMARY CARE PHYSICIAN:  Olene Craven, M.D.   PSYCHIATRIC PHYSICIAN:  Geoffery Lyons, M.D.   CHIEF COMPLAINT:  Lethargy and weakness.   HISTORY OF PRESENT ILLNESS:  This is a limited history by the patient,  mostly obtained by ED physician and EMS reports and transfer reports.  This  is a 55 year old Caucasian male with past medical history of bipolar  disease, diabetes, hypercholesterolemia, chronic obstructive pulmonary  disease, tobacco abuse, and a history of suicidal ideations, who presents  with lethargy and low glucose from [facility] nursing home. The patient  initially in the emergency department was confused and combative but at the  time of current H&P was more calm and conversive.  He remains lethargic and  somnolent, however.  No other history is available at this time.   EMS reports show glucose in the 90s, adequate blood pressure and oxygen  saturations.   PAST MEDICAL HISTORY:  1.  Bipolar disease.  2.  Diabetes type 2.  3.  Chronic obstructive pulmonary disease.  4.  Tobacco abuse.  5.  Hypercholesterolemia.  6.  Hypertension.   MEDICATIONS ON ADMISSION:  1.  HCTZ 25 daily.  2.  Amaryl 4 mg daily.  3.  Wellbutrin 150 mg daily.  4.  Mobic 15 mg daily.  5.  Lisinopril 20 mg daily.  6.  Spiriva one puff daily.  7.  Glucophage 500 mg twice daily.  8.  Cogentin 1 mg twice daily.  9.  Klonopin 1 mg twice daily.  10. Valium 3 mg by mouth three times daily.  11. Restoril 30 mg by mouth at bedtime.  12. Albuterol inhaler as needed.  13. Seroquel 200 mg at bedtime.  14. Elavil 75 mg at bedtime.  15. Lipitor 10  mg at bedtime.  16. Soma 250 mg four times daily.  17. Vicodin 10 one tablet four times daily.  18. Advair 250/50 one puff twice daily.  19. Lithium 300 mg one daily.   ALLERGIES:  PENICILLIN (unknown reaction).   FAMILY HISTORY:  No known medical illnesses.   SOCIAL HISTORY:  Lives at [facility] nursing home.  Smokes half pack of  cigarettes for the last 32 years.  Denies any significant alcohol or other  illicit drug use.   REVIEW OF SYSTEMS:  Unobtainable.   PHYSICAL EXAMINATION:  VITAL SIGNS:  Temperature 97, blood pressure 91/44,  pulse 85, respiratory rate 20, oxygen saturations 95% on room air.  GENERAL:  This is a somnolent obese white male in bed.  HEENT:  Unremarkable.  No nystagmus.  Pupils equal, round and reactive to  light.  NECK:  Without JVP or masses and supple.  No lymphadenopathy.  CARDIAC:  Regular rate and rhythm, no murmurs, rubs or  gallops.  CHEST:  Clear to auscultation bilaterally with good air movement.  ABDOMEN:  Morbidly obese, positive bowel sounds, nontender, nondistended.  EXTREMITIES:  Without cyanosis, clubbing or edema.  Pulses intact, 2+  bilaterally upper and lower.  NEUROLOGIC:  He is alert, able to answer questions appropriately but slow in  returning his answers.  He is drifting off to sleep during the examination  and history.  Easily awakened, however.  Somewhat confused as he still is  asking to notify the nursing home as he does not remember being transported  from there.   LABORATORY DATA:  Lithium 2.6, white blood cell count 10,100, hemoglobin  10.6, hematocrit 30.7, __________  91, platelet count 269,000.  Sodium 133,  potassium 4.8, chloride 108, bicarb 25, glucose 84, BUN  34, creatinine 2.3,  baseline 1.5 in December 2005. Calcium 8.6, total protein 5.4, albumin 3.3,  AST of 14, ALT of 14, alk phos of 79, bilirubin of 0.4.   ASSESSMENT/PLAN:  1.  Lithium toxicity.  2.  Acute renal failure.  3.  Bipolar disease.  4.  Diabetes  type 2.  5.  Chronic obstructive pulmonary disease.  6.  Tobacco abuse.  7.  Hypercholesterolemia.  8.  Deep venous thrombosis/gastrointestinal prophylaxis.   PLAN:  To admit to step down unit for close observation. Hold all  medications.  Will keep Ativan available for p.r.n. basis for agitation and  seizure activity.  This is if his blood pressure tolerates.  Will push IV  fluids as this will help increase clearance of his lithium levels.  Will  follow up his electrolytes, BUN and creatinine in the morning as well as a  lithium level.  Will place a Foley catheter and check CBGs and cover with  sliding scale.  Will give him breathing treatments on a p.r.n. basis.  Monitor his telemetry activity as he is prone to arrhythmias and  cardiovascular collapse.  DVT prophylaxis will be given with Pepcid and PAS  hose.  Will also check Tylenol,  alcohol and aspirin levels; however, I expect none of these to be elevated  as his medications are provided to him via the staff at the [facility].  In  the morning will check a 12-lead EKG along with his a.m. labs.  Also  nicotine patch for history of tobacco abuse.      JD/MEDQ  D:  06/20/2004  T:  06/20/2004  Job:  [id number]   cc:   Olene Craven, M.D.  [address]  [address]  [place]  [place] [address]  Fax: [phone]   Geoffery Lyons, M.D.

## 2010-09-28 NOTE — Discharge Summary (Signed)
NAME:  Ronald, Martin NO.:  [id number]   MEDICAL RECORD NO.:  [id number]           PATIENT TYPE:   LOCATION:                                 FACILITY:   PHYSICIAN:  Anselm Jungling, MD  DATE OF BIRTH:  29-Feb-1956   DATE OF ADMISSION:  06/06/2008  DATE OF DISCHARGE:  06/20/2008                               DISCHARGE SUMMARY   IDENTIFYING DATA AND REASON FOR ADMISSION:  This was an inpatient  psychiatric admission for Ronald Martin, a 55 year old unmarried Caucasian male  with a history of schizoaffective disorder.  He was admitted due to  severe and acute decompensation.  Please refer to the admission note for  further details pertaining to the symptoms, circumstances and history  that led to his hospitalization.  He was given an initial Axis I  diagnosis of schizoaffective disorder.   MEDICAL AND LABORATORY:  The patient was medically and physically  assessed by the psychiatric nurse practitioner.  He came to [place] with a  history of diabetes mellitus, COPD, and dyslipidemia.  There were no  significant medical issues during his inpatient stay.  He was continued  on his usual regimen of Detrol, lisinopril, glyburide, Spiriva, Advair,  carvedilol, simvastatin, aspirin, Niaspan, Tricor, Ventolin, with  additional insulin protocol for extra coverage.  He was also given  Hydrocodone on a regular basis due to chronic pain.  There were no acute  medical issues.   HOSPITAL COURSE:  The patient was admitted to the adult inpatient  psychiatric service.  He presented as an obese and disorganized  gentleman who was quite disorganized, tangential, and delusional.  He  had no insight into his illness.  He required one-to-one staffing due to  severe agitation, paranoia, and lack of appropriate boundaries.  He is  treated with a psychotropic regimen that included diazepam, Seroquel,  gabapentin, and Topamax.  All of these except Seroquel had been part of  his regimen at the time of  his admission.   We learned that he had been residing at the [facility].  It was  many days before the [facility] returned our calls to provide  [place] further background information about the patient, and for them to  indicate whether or not they were willing to have the patient return to  their facility.  Fortunately, they were willing.   The patient's stabilization was quite gradual, but he was able to gain a  level of mood stability, such that he was consistently pleasant, affable  and sociable, without irritability or any difficulties with impulse  control.  His thinking remained mildly tangential at best.   On the 16th hospital day the patient appeared to have improved enough  that he could return to his group home.  Group home staff who knew him  well over time concurred that this was the case.  The following  aftercare arrangements were made.   AFTERCARE:  The patient was to follow-up at [facility] with an intake appointment on June 24, 2008 at 9:30 a.m.   DISCHARGE MEDICATIONS:  1. Lisinopril 20  mg daily.  2. Glyburide 5 mg q.a.m.  3. Ambien 10 mg nightly.  4. Hydrocodone 5/325, one to two tablets 4 times daily as needed for      pain.  5. Gabapentin 300 mg q.i.d.  6. Levothyroxine 75 mcg daily.  7. Topamax 100 mg daily.  8. Advair Diskus 1 puff twice daily.  9. Valium 5 mg three times daily.  10.Aspirin 81 mg daily.  11.Spiriva 18 mcg daily.  12.Niaspan 500 mg, three tablets at bedtime.  13.Tricor 145 mg daily.  14.Zocor 10 mg daily.  15.Lithium carbonate controlled release 450 mg b.i.d.   Shortly before discharge, Seroquel was replaced with Haldol, 10 mg  q.a.m. and 20 mg nightly, because of concerns about the high cost of  Seroquel relative to Haldol, and the fact the patient had no financial  support for continuing Seroquel.  He was on Haldol during the last 36 -  48 hours of his stay with [place], and it appeared to be well  tolerated.  He  was also discharged on carvedilol 3.125 mg b.i.d. and lactulose 15 mg  daily p.r.n. constipation.   DISCHARGE DIAGNOSES:  AXIS I:  Schizoaffective disorder, most recently  manic with psychotic features.  AXIS II:  Deferred.  AXIS III:  History of diabetes mellitus, hypertension, hypothyroidism,  dyslipidemia.  AXIS IV:  Stressors severe.  AXIS V:  Global Assessment of Functioning on discharge 50.      Anselm Jungling, MD  Electronically Signed     SPB/MEDQ  D:  06/27/2008  T:  06/27/2008  Job:  [id number]
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
## 2011-05-14 ENCOUNTER — Inpatient Hospital Stay: Payer: Self-pay | Admitting: Psychiatry

## 2011-05-14 LAB — SALICYLATE LEVEL: Salicylates, Serum: 3 mg/dL — ABNORMAL HIGH

## 2011-05-14 LAB — DRUG SCREEN, URINE
Barbiturates, Ur Screen: NEGATIVE (ref ?–200)
Benzodiazepine, Ur Scrn: NEGATIVE (ref ?–200)
Cannabinoid 50 Ng, Ur ~~LOC~~: NEGATIVE (ref ?–50)
Methadone, Ur Screen: NEGATIVE (ref ?–300)
Opiate, Ur Screen: NEGATIVE (ref ?–300)
Phencyclidine (PCP) Ur S: NEGATIVE (ref ?–25)

## 2011-05-14 LAB — COMPREHENSIVE METABOLIC PANEL
Alkaline Phosphatase: 61 U/L (ref 50–136)
Calcium, Total: 8.9 mg/dL (ref 8.5–10.1)
Co2: 27 mmol/L (ref 21–32)
EGFR (Non-African Amer.): >60
Glucose: 178 mg/dL — ABNORMAL HIGH (ref 65–99)
Osmolality: 294 (ref 275–301)
SGOT(AST): 14 U/L — ABNORMAL LOW (ref 15–37)
SGPT (ALT): 14 U/L
Sodium: 145 mmol/L (ref 136–145)

## 2011-05-14 LAB — CBC
HCT: 47.6 % (ref 40.0–52.0)
HGB: 16.1 g/dL (ref 13.0–18.0)
MCV: 95 fL (ref 80–100)
RBC: 5 10*6/uL (ref 4.40–5.90)
RDW: 13.1 % (ref 11.5–14.5)

## 2011-05-14 LAB — ETHANOL
Ethanol %: 0.108 % — ABNORMAL HIGH (ref 0.000–0.080)
Ethanol: 108 mg/dL

## 2011-05-14 LAB — ACETAMINOPHEN LEVEL: Acetaminophen: <2 ug/mL

## 2011-05-17 LAB — VALPROIC ACID LEVEL: Valproic Acid: 33 ug/mL — ABNORMAL LOW

## 2011-12-13 ENCOUNTER — Encounter: Payer: Self-pay | Admitting: Family Medicine

## 2012-01-12 ENCOUNTER — Encounter: Payer: Self-pay | Admitting: Family Medicine

## 2012-11-15 ENCOUNTER — Emergency Department: Payer: Self-pay | Admitting: Emergency Medicine

## 2012-11-15 LAB — COMPREHENSIVE METABOLIC PANEL
Alkaline Phosphatase: 69 U/L (ref 50–136)
Anion Gap: 6 — ABNORMAL LOW (ref 7–16)
Bilirubin,Total: 0.2 mg/dL (ref 0.2–1.0)
Chloride: 105 mmol/L (ref 98–107)
Co2: 29 mmol/L (ref 21–32)
Creatinine: 1.15 mg/dL (ref 0.60–1.30)
EGFR (African American): >60
Glucose: 137 mg/dL — ABNORMAL HIGH (ref 65–99)
Potassium: 4.2 mmol/L (ref 3.5–5.1)
SGOT(AST): 7 U/L — ABNORMAL LOW (ref 15–37)
Sodium: 140 mmol/L (ref 136–145)

## 2012-11-15 LAB — CBC
HCT: 40.6 % (ref 40.0–52.0)
MCHC: 34.4 g/dL (ref 32.0–36.0)
MCV: 92 fL (ref 80–100)
WBC: 9.4 10*3/uL (ref 3.8–10.6)

## 2012-11-15 LAB — TROPONIN I: Troponin-I: <0.02 ng/mL

## 2013-03-12 ENCOUNTER — Ambulatory Visit: Payer: Self-pay | Admitting: Podiatry

## 2013-04-06 ENCOUNTER — Encounter: Payer: Self-pay | Admitting: Podiatry

## 2013-04-06 ENCOUNTER — Ambulatory Visit (INDEPENDENT_AMBULATORY_CARE_PROVIDER_SITE_OTHER): Payer: Medicare Other | Admitting: Podiatry

## 2013-04-06 VITALS — BP 113/63 | HR 83 | Resp 18 | Ht 71.0 in | Wt 240.0 lb

## 2013-04-06 DIAGNOSIS — B351 Tinea unguium: Secondary | ICD-10-CM

## 2013-04-06 DIAGNOSIS — M79609 Pain in unspecified limb: Secondary | ICD-10-CM

## 2013-04-07 NOTE — Progress Notes (Signed)
Subjective:     Patient ID: Ronald Martin, male   DOB: 1955-09-12, 57 y.o.   MRN: [id number]  HPI patient states I have painful thick elongated nails 1-5 both feet that I cannot cut my cell Review of Systems     Objective:   Physical Exam Neurovascular status that is diminished in a mentally challenged male who has thick painful toenails 1-5 both    Assessment:     Mycotic nails with pain 1-5 both feet    Plan:      debridement of painful nails 1-5 both feet

## 2013-07-06 ENCOUNTER — Ambulatory Visit: Payer: Medicare Other | Admitting: Podiatry

## 2013-10-01 ENCOUNTER — Ambulatory Visit: Payer: Medicare Other | Admitting: Podiatry

## 2013-10-15 ENCOUNTER — Ambulatory Visit (INDEPENDENT_AMBULATORY_CARE_PROVIDER_SITE_OTHER): Payer: Medicare Other | Admitting: Podiatry

## 2013-10-15 VITALS — BP 155/104 | HR 84 | Resp 16

## 2013-10-15 DIAGNOSIS — B351 Tinea unguium: Secondary | ICD-10-CM

## 2013-10-15 DIAGNOSIS — M79609 Pain in unspecified limb: Secondary | ICD-10-CM

## 2013-10-15 NOTE — Progress Notes (Signed)
Subjective:     Patient ID: Ronald Martin, male   DOB: [birth year], 58 y.o.   MRN: [id number]  HPI patient presents with severe elongation of nailbeds 1-5 both feet that are thick yellow and he does not have ability to cut   Review of Systems     Objective:   Physical Exam Neurovascular status unchanged with severe the long gaited thick yellow brittle nails 1-5 bilateral    Assessment:     Mycotic nail infection with pain 1-5 both feet    Plan:     Debridement painful nailbeds 1-5 both feet with no iatrogenic bleeding noted

## 2014-01-18 ENCOUNTER — Ambulatory Visit: Payer: Medicare Other | Admitting: Podiatry

## 2014-01-19 ENCOUNTER — Emergency Department: Payer: Self-pay | Admitting: Emergency Medicine

## 2014-01-19 LAB — BASIC METABOLIC PANEL
ANION GAP: 12 (ref 7–16)
BUN: 12 mg/dL (ref 7–18)
CALCIUM: 8.5 mg/dL (ref 8.5–10.1)
CHLORIDE: 108 mmol/L — AB (ref 98–107)
Co2: 23 mmol/L (ref 21–32)
Creatinine: 1.38 mg/dL — ABNORMAL HIGH (ref 0.60–1.30)
GFR CALC NON AF AMER: 56 — AB
GLUCOSE: 184 mg/dL — AB (ref 65–99)
Osmolality: 289 (ref 275–301)
POTASSIUM: 4.2 mmol/L (ref 3.5–5.1)
Sodium: 143 mmol/L (ref 136–145)

## 2014-01-19 LAB — URINALYSIS, COMPLETE
BLOOD: NEGATIVE
Bacteria: NONE SEEN
Bilirubin,UR: NEGATIVE
GLUCOSE, UR: NEGATIVE mg/dL (ref 0–75)
Leukocyte Esterase: NEGATIVE
NITRITE: NEGATIVE
Ph: 6 (ref 4.5–8.0)
Protein: NEGATIVE
RBC,UR: 1 /HPF (ref 0–5)
Specific Gravity: 1.008 (ref 1.003–1.030)
Squamous Epithelial: NONE SEEN
WBC UR: 1 /HPF (ref 0–5)

## 2014-01-19 LAB — VALPROIC ACID LEVEL: Valproic Acid: 74 ug/mL

## 2014-01-19 LAB — CBC
HCT: 46.5 % (ref 40.0–52.0)
HGB: 14.9 g/dL (ref 13.0–18.0)
MCH: 30.5 pg (ref 26.0–34.0)
MCHC: 32.1 g/dL (ref 32.0–36.0)
MCV: 95 fL (ref 80–100)
Platelet: 141 10*3/uL — ABNORMAL LOW (ref 150–440)
RBC: 4.89 10*6/uL (ref 4.40–5.90)
RDW: 12.9 % (ref 11.5–14.5)
WBC: 6.7 10*3/uL (ref 3.8–10.6)

## 2014-01-19 LAB — CK TOTAL AND CKMB (NOT AT ARMC)
CK, TOTAL: 57 U/L
CK-MB: 0.7 ng/mL (ref 0.5–3.6)

## 2014-01-19 LAB — TROPONIN I

## 2014-02-11 ENCOUNTER — Ambulatory Visit (INDEPENDENT_AMBULATORY_CARE_PROVIDER_SITE_OTHER): Payer: Medicare Other | Admitting: Podiatry

## 2014-02-11 DIAGNOSIS — B351 Tinea unguium: Secondary | ICD-10-CM

## 2014-02-11 DIAGNOSIS — M79676 Pain in unspecified toe(s): Secondary | ICD-10-CM

## 2014-02-13 NOTE — Progress Notes (Signed)
Subjective:     Patient ID: Ronald Martin, male   DOB: 1955-05-24, 58 y.o.   MRN: [id number]  HPI patient presents with thick nailbeds 1-5 both feet that he has no ability to cut and air painful   Review of Systems     Objective:   Physical Exam Neurovascular status intact with thick yellow brittle nailbeds 1-5 both feet that are painful    Assessment:     Mycotic nail infection with pain 1-5 both feet    Plan:     Debride painful nailbeds 1-5 both feet with no iatrogenic bleeding noted

## 2014-05-20 ENCOUNTER — Other Ambulatory Visit: Payer: Medicare Other

## 2014-06-17 ENCOUNTER — Ambulatory Visit (INDEPENDENT_AMBULATORY_CARE_PROVIDER_SITE_OTHER): Payer: Medicare Other | Admitting: Podiatry

## 2014-06-17 DIAGNOSIS — B351 Tinea unguium: Secondary | ICD-10-CM

## 2014-06-17 DIAGNOSIS — M79676 Pain in unspecified toe(s): Secondary | ICD-10-CM

## 2014-06-17 NOTE — Progress Notes (Signed)
Subjective:     Patient ID: Ronald Martin, male   DOB: [birth year], 59 y.o.   MRN: [id number]  HPI patient presents with thick nailbeds 1-5 both feet that he has no ability to cut and air painful   Review of Systems     Objective:   Physical Exam Neurovascular status intact with thick yellow brittle nailbeds 1-5 both feet that are painful    Assessment:     Mycotic nail infection with pain 1-5 both feet    Plan:     Debride painful nailbeds 1-5 both feet with no iatrogenic bleeding noted

## 2014-08-03 ENCOUNTER — Emergency Department: Payer: Self-pay | Admitting: Emergency Medicine

## 2014-08-09 ENCOUNTER — Emergency Department: Payer: Self-pay | Admitting: Emergency Medicine

## 2014-08-09 LAB — BASIC METABOLIC PANEL
Anion Gap: 6 — ABNORMAL LOW (ref 7–16)
BUN: 13 mg/dL
Calcium, Total: 8.8 mg/dL — ABNORMAL LOW
Chloride: 102 mmol/L
Co2: 28 mmol/L
Creatinine: 0.89 mg/dL
EGFR (African American): >60
EGFR (Non-African Amer.): >60
Glucose: 123 mg/dL — ABNORMAL HIGH
Potassium: 3.7 mmol/L
Sodium: 136 mmol/L

## 2014-08-09 LAB — URINALYSIS, COMPLETE
BILIRUBIN, UR: NEGATIVE
Bacteria: NONE SEEN
Blood: NEGATIVE
GLUCOSE, UR: NEGATIVE mg/dL (ref 0–75)
Leukocyte Esterase: NEGATIVE
Nitrite: NEGATIVE
PH: 5 (ref 4.5–8.0)
Protein: NEGATIVE
SQUAMOUS EPITHELIAL: NONE SEEN
Specific Gravity: 1.019 (ref 1.003–1.030)

## 2014-08-10 ENCOUNTER — Emergency Department: Admit: 2014-08-10 | Disposition: A | Discharge status: Home / Self Care | Payer: Self-pay | Admitting: Student

## 2014-08-16 ENCOUNTER — Emergency Department
Admit: 2014-08-16 | Disposition: A | Discharge status: Home / Self Care | Payer: Self-pay | Admitting: Emergency Medicine

## 2014-08-16 LAB — URINALYSIS, COMPLETE
Bilirubin,UR: NEGATIVE
Glucose,UR: 50 mg/dL (ref 0–75)
Nitrite: NEGATIVE
Ph: 5 (ref 4.5–8.0)
Protein: 100
RBC,UR: 120 /HPF (ref 0–5)
Specific Gravity: 1.034 (ref 1.003–1.030)
Squamous Epithelial: <1
WBC UR: 4 /HPF (ref 0–5)

## 2014-08-26 ENCOUNTER — Emergency Department
Admit: 2014-08-26 | Disposition: A | Discharge status: Other Acute Inpatient Hospital | Payer: Self-pay | Admitting: Emergency Medicine

## 2014-08-26 LAB — CBC
HCT: 43.6 % (ref 40.0–52.0)
HGB: 15.4 g/dL (ref 13.0–18.0)
MCH: 31.8 pg (ref 26.0–34.0)
MCHC: 35.3 g/dL (ref 32.0–36.0)
MCV: 90 fL (ref 80–100)
Platelet: 298 10*3/uL (ref 150–440)
RBC: 4.83 10*6/uL (ref 4.40–5.90)
RDW: 13 % (ref 11.5–14.5)
WBC: 9.6 10*3/uL (ref 3.8–10.6)

## 2014-08-26 LAB — COMPREHENSIVE METABOLIC PANEL
ALT: 13 U/L — AB
ANION GAP: 10 (ref 7–16)
Albumin: 3.9 g/dL
Alkaline Phosphatase: 42 U/L
BILIRUBIN TOTAL: 0.5 mg/dL
BUN: 5 mg/dL — ABNORMAL LOW
CALCIUM: 9.1 mg/dL
CO2: 24 mmol/L
CREATININE: 0.76 mg/dL
Chloride: 93 mmol/L — ABNORMAL LOW
EGFR (African American): >60
Glucose: 140 mg/dL — ABNORMAL HIGH
POTASSIUM: 3.8 mmol/L
SGOT(AST): 22 U/L
SODIUM: 127 mmol/L — AB
Total Protein: 6.6 g/dL

## 2014-08-26 LAB — URINALYSIS, COMPLETE
BACTERIA: NONE SEEN
Bilirubin,UR: NEGATIVE
Blood: NEGATIVE
GLUCOSE, UR: NEGATIVE mg/dL (ref 0–75)
Leukocyte Esterase: NEGATIVE
NITRITE: NEGATIVE
Ph: 7 (ref 4.5–8.0)
Protein: NEGATIVE
SPECIFIC GRAVITY: 1.002 (ref 1.003–1.030)
Squamous Epithelial: NONE SEEN

## 2014-08-28 ENCOUNTER — Emergency Department
Admit: 2014-08-28 | Disposition: A | Discharge status: Home / Self Care | Payer: Self-pay | Admitting: Emergency Medicine
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
## 2014-09-05 ENCOUNTER — Emergency Department
Admit: 2014-09-05 | Disposition: A | Discharge status: Home / Self Care | Payer: Self-pay | Admitting: Emergency Medicine

## 2014-09-06 ENCOUNTER — Emergency Department
Admit: 2014-09-06 | Disposition: A | Discharge status: Home / Self Care | Payer: Self-pay | Admitting: Emergency Medicine

## 2014-09-16 ENCOUNTER — Ambulatory Visit: Payer: Medicare Other

## 2014-12-01 ENCOUNTER — Telehealth: Payer: Self-pay

## 2014-12-01 NOTE — Telephone Encounter (Signed)
Pt pharmacy is requesting a refill on finasteride. Ronald Martin started pt on medication. Dr. Apolinar Martin did a cysto in May, noted a small short prostate, but no mention of if pt should continue medication. Pt was supposed to have a 6wk f/u appt and no showed. Please advise.

## 2014-12-02 NOTE — Telephone Encounter (Signed)
LMOM

## 2014-12-02 NOTE — Telephone Encounter (Signed)
Patient needs a follow up prior to filling the medication.

## 2014-12-06 NOTE — Telephone Encounter (Signed)
LMOM

## 2014-12-07 NOTE — Telephone Encounter (Signed)
[facility]- letter sent to pt in reference to medication

## 2014-12-23 ENCOUNTER — Ambulatory Visit: Payer: Self-pay | Admitting: Urology

## 2014-12-23 ENCOUNTER — Encounter: Payer: Self-pay | Admitting: Urology

## 2015-02-16 ENCOUNTER — Ambulatory Visit (INDEPENDENT_AMBULATORY_CARE_PROVIDER_SITE_OTHER): Payer: Medicare Other | Admitting: Podiatry

## 2015-02-16 DIAGNOSIS — B351 Tinea unguium: Secondary | ICD-10-CM | POA: Diagnosis not present

## 2015-02-16 DIAGNOSIS — M79676 Pain in unspecified toe(s): Secondary | ICD-10-CM | POA: Diagnosis not present

## 2015-02-16 NOTE — Progress Notes (Signed)
Subjective:     Patient ID: Ronald Martin, male   DOB: 15-Oct-1955, 59 y.o.   MRN: [id number]  Patient presents with thick nailbeds 1-5 both feet that he has no ability to cut and are painful.denies any redness or drainage from the nail sites. No new complaints today.      Objective:   Physical Exam Neurovascular status intact and unchanged. Nails are hypertrophic, dystrophic, brittle, discolored, elongated x10. His tenderness to palpation of the nails 1-5 bilaterally. No surrounding erythema or drainage. No open lesions or pre-ulcerative lesions. No pain with calf compression, swelling, warmth, erythema.    Assessment:     Mycotic nail infection with pain 1-5 both feet    Plan:     Debride painful nailbeds 1-5 both feet with no iatrogenic bleeding noted. Follow-up in 3 months or sooner if any palms are to arise. Call any questions or concerns in the meantime.  Ovid Curd, DPM

## 2015-05-11 ENCOUNTER — Encounter: Payer: Self-pay | Admitting: Podiatry

## 2015-05-25 ENCOUNTER — Ambulatory Visit: Payer: Medicare Other | Admitting: Podiatry

## 2015-06-01 ENCOUNTER — Ambulatory Visit: Payer: Medicare Other | Admitting: Podiatry
